# Patient Record
Sex: Female | Born: 1955 | Race: White | Hispanic: No | Marital: Single | Smoking: Former smoker
Health system: Southern US, Community
[De-identification: ages and names within clinical notes are randomized; demographics above are authoritative.]

## PROBLEM LIST (undated history)

## (undated) DIAGNOSIS — Z803 Family history of malignant neoplasm of breast: Secondary | ICD-10-CM

## (undated) DIAGNOSIS — Z8052 Family history of malignant neoplasm of bladder: Secondary | ICD-10-CM

## (undated) HISTORY — PX: BREAST ENHANCEMENT SURGERY: SHX7

## (undated) HISTORY — PX: AUGMENTATION MAMMAPLASTY: SUR837

## (undated) HISTORY — PX: PARTIAL HYSTERECTOMY: SHX80

## (undated) HISTORY — DX: Family history of malignant neoplasm of breast: Z80.3

## (undated) HISTORY — DX: Family history of malignant neoplasm of bladder: Z80.52

---

## 2019-06-25 ENCOUNTER — Ambulatory Visit: Payer: Self-pay | Admitting: Medical

## 2019-06-25 ENCOUNTER — Ambulatory Visit: Payer: Self-pay | Admitting: Family

## 2019-06-29 ENCOUNTER — Other Ambulatory Visit: Payer: Self-pay

## 2019-06-29 ENCOUNTER — Encounter: Payer: Self-pay | Admitting: Medical

## 2019-06-29 ENCOUNTER — Ambulatory Visit (INDEPENDENT_AMBULATORY_CARE_PROVIDER_SITE_OTHER): Payer: PRIVATE HEALTH INSURANCE | Admitting: Medical

## 2019-06-29 VITALS — BP 120/69 | HR 74 | Temp 98.1°F | Resp 12 | Ht 66.0 in | Wt 173.6 lb

## 2019-06-29 DIAGNOSIS — B009 Herpesviral infection, unspecified: Secondary | ICD-10-CM

## 2019-06-29 DIAGNOSIS — L989 Disorder of the skin and subcutaneous tissue, unspecified: Secondary | ICD-10-CM

## 2019-06-29 DIAGNOSIS — Z1231 Encounter for screening mammogram for malignant neoplasm of breast: Secondary | ICD-10-CM

## 2019-06-29 DIAGNOSIS — G4700 Insomnia, unspecified: Secondary | ICD-10-CM

## 2019-06-29 DIAGNOSIS — R4586 Emotional lability: Secondary | ICD-10-CM | POA: Diagnosis not present

## 2019-06-29 DIAGNOSIS — F17200 Nicotine dependence, unspecified, uncomplicated: Secondary | ICD-10-CM

## 2019-06-29 DIAGNOSIS — Z0000 Encounter for general adult medical examination without abnormal findings: Secondary | ICD-10-CM | POA: Diagnosis not present

## 2019-06-29 LAB — LIPID PANEL
Cholesterol: 196 mg/dL (ref 0–200)
HDL: 51.9 mg/dL (ref >39.00)
NonHDL: 143.96
Total CHOL/HDL Ratio: 4
Triglycerides: 263 mg/dL — ABNORMAL HIGH (ref 0.0–149.0)
VLDL: 52.6 mg/dL — ABNORMAL HIGH (ref 0.0–40.0)

## 2019-06-29 LAB — COMPREHENSIVE METABOLIC PANEL
ALT: 16 U/L (ref 0–35)
AST: 12 U/L (ref 0–37)
Albumin: 4.2 g/dL (ref 3.5–5.2)
Alkaline Phosphatase: 76 U/L (ref 39–117)
BUN: 17 mg/dL (ref 6–23)
CO2: 29 mEq/L (ref 19–32)
Calcium: 9.5 mg/dL (ref 8.4–10.5)
Chloride: 103 mEq/L (ref 96–112)
Creatinine, Ser: 0.65 mg/dL (ref 0.40–1.20)
GFR: 92.37 mL/min (ref >60.00)
Glucose, Bld: 95 mg/dL (ref 70–99)
Potassium: 4.7 mEq/L (ref 3.5–5.1)
Sodium: 137 mEq/L (ref 135–145)
Total Bilirubin: 0.3 mg/dL (ref 0.2–1.2)
Total Protein: 6.6 g/dL (ref 6.0–8.3)

## 2019-06-29 LAB — CBC WITH DIFFERENTIAL/PLATELET
Basophils Absolute: 0 10*3/uL (ref 0.0–0.1)
Basophils Relative: 0.2 % (ref 0.0–3.0)
Eosinophils Absolute: 0.1 10*3/uL (ref 0.0–0.7)
Eosinophils Relative: 1.2 % (ref 0.0–5.0)
HCT: 45 % (ref 36.0–46.0)
Hemoglobin: 14.8 g/dL (ref 12.0–15.0)
Lymphocytes Relative: 38.9 % (ref 12.0–46.0)
Lymphs Abs: 2.5 10*3/uL (ref 0.7–4.0)
MCHC: 32.9 g/dL (ref 30.0–36.0)
MCV: 89.2 fl (ref 78.0–100.0)
Monocytes Absolute: 0.5 10*3/uL (ref 0.1–1.0)
Monocytes Relative: 7.6 % (ref 3.0–12.0)
Neutro Abs: 3.4 10*3/uL (ref 1.4–7.7)
Neutrophils Relative %: 52.1 % (ref 43.0–77.0)
Platelets: 293 10*3/uL (ref 150.0–400.0)
RBC: 5.05 Mil/uL (ref 3.87–5.11)
RDW: 13 % (ref 11.5–15.5)
WBC: 6.5 10*3/uL (ref 4.0–10.5)

## 2019-06-29 LAB — LDL CHOLESTEROL, DIRECT: Direct LDL: 116 mg/dL

## 2019-06-29 MED ORDER — VALACYCLOVIR HCL 1 G PO TABS: 1000.0000 mg | ORAL_TABLET | Freq: Every day | ORAL | 11 refills | Status: DC

## 2019-06-29 MED FILL — valACYclovir HCL 1 GM TABS: 1 | 30 days supply | Qty: 30 | Fill #0

## 2019-06-29 NOTE — Patient Instructions (Addendum)
For you wellness exam today I have ordered cbc, cmp and lipid panel.  Flu vaccine declined.  Recommend exercise and healthy diet.  We will let you know lab results as they come in.  Follow up date appointment will be determined after lab review.   For smoking cessation and mood change will rx wellbutrin. Early and not fully convinced depression. But wellbutrin can benefit both.  Put in screening mammogram.  For hx of herpes refilled valtres.  For insomnia will follow and see if sleep better with more exercise and wellbutrin. Med options discussed.   Preventive Care 26-70 Years Old, Female Preventive care refers to visits with your health care provider and lifestyle choices that can promote health and wellness. This includes:  A yearly physical exam. This may also be called an annual well check.  Regular dental visits and eye exams.  Immunizations.  Screening for certain conditions.  Healthy lifestyle choices, such as eating a healthy diet, getting regular exercise, not using drugs or products that contain nicotine and tobacco, and limiting alcohol use. What can I expect for my preventive care visit? Physical exam Your health care provider will check your:  Height and weight. This may be used to calculate body mass index (BMI), which tells if you are at a healthy weight.  Heart rate and blood pressure.  Skin for abnormal spots. Counseling Your health care provider may ask you questions about your:  Alcohol, tobacco, and drug use.  Emotional well-being.  Home and relationship well-being.  Sexual activity.  Eating habits.  Work and work Statistician.  Method of birth control.  Menstrual cycle.  Pregnancy history. What immunizations do I need?  Influenza (flu) vaccine  This is recommended every year. Tetanus, diphtheria, and pertussis (Tdap) vaccine  You may need a Td booster every 10 years. Varicella (chickenpox) vaccine  You may need this if you have  not been vaccinated. Zoster (shingles) vaccine  You may need this after age 18. Measles, mumps, and rubella (MMR) vaccine  You may need at least one dose of MMR if you were born in 1957 or later. You may also need a second dose. Pneumococcal conjugate (PCV13) vaccine  You may need this if you have certain conditions and were not previously vaccinated. Pneumococcal polysaccharide (PPSV23) vaccine  You may need one or two doses if you smoke cigarettes or if you have certain conditions. Meningococcal conjugate (MenACWY) vaccine  You may need this if you have certain conditions. Hepatitis A vaccine  You may need this if you have certain conditions or if you travel or work in places where you may be exposed to hepatitis A. Hepatitis B vaccine  You may need this if you have certain conditions or if you travel or work in places where you may be exposed to hepatitis B. Haemophilus influenzae type b (Hib) vaccine  You may need this if you have certain conditions. Human papillomavirus (HPV) vaccine  If recommended by your health care provider, you may need three doses over 6 months. You may receive vaccines as individual doses or as more than one vaccine together in one shot (combination vaccines). Talk with your health care provider about the risks and benefits of combination vaccines. What tests do I need? Blood tests  Lipid and cholesterol levels. These may be checked every 5 years, or more frequently if you are over 76 years old.  Hepatitis C test.  Hepatitis B test. Screening  Lung cancer screening. You may have this screening every year  starting at age 19 if you have a 30-pack-year history of smoking and currently smoke or have quit within the past 15 years.  Colorectal cancer screening. All adults should have this screening starting at age 32 and continuing until age 80. Your health care provider may recommend screening at age 29 if you are at increased risk. You will have tests  every 1-10 years, depending on your results and the type of screening test.  Diabetes screening. This is done by checking your blood sugar (glucose) after you have not eaten for a while (fasting). You may have this done every 1-3 years.  Mammogram. This may be done every 1-2 years. Talk with your health care provider about when you should start having regular mammograms. This may depend on whether you have a family history of breast cancer.  BRCA-related cancer screening. This may be done if you have a family history of breast, ovarian, tubal, or peritoneal cancers.  Pelvic exam and Pap test. This may be done every 3 years starting at age 33. Starting at age 7, this may be done every 5 years if you have a Pap test in combination with an HPV test. Other tests  Sexually transmitted disease (STD) testing.  Bone density scan. This is done to screen for osteoporosis. You may have this scan if you are at high risk for osteoporosis. Follow these instructions at home: Eating and drinking  Eat a diet that includes fresh fruits and vegetables, whole grains, lean protein, and low-fat dairy.  Take vitamin and mineral supplements as recommended by your health care provider.  Do not drink alcohol if: ? Your health care provider tells you not to drink. ? You are pregnant, may be pregnant, or are planning to become pregnant.  If you drink alcohol: ? Limit how much you have to 0-1 drink a day. ? Be aware of how much alcohol is in your drink. In the [place] one drink equals one 12 oz bottle of beer (355 mL), one 5 oz glass of wine (148 mL), or one 1 oz glass of hard liquor (44 mL). Lifestyle  Take daily care of your teeth and gums.  Stay active. Exercise for at least 30 minutes on 5 or more days each week.  Do not use any products that contain nicotine or tobacco, such as cigarettes, e-cigarettes, and chewing tobacco. If you need help quitting, ask your health care provider.  If you are sexually  active, practice safe sex. Use a condom or other form of birth control (contraception) in order to prevent pregnancy and STIs (sexually transmitted infections).  If told by your health care provider, take low-dose aspirin daily starting at age 84. What's next?  Visit your health care provider once a year for a well check visit.  Ask your health care provider how often you should have your eyes and teeth checked.  Stay up to date on all vaccines. This information is not intended to replace advice given to you by your health care provider. Make sure you discuss any questions you have with your health care provider. Document Revised: 01/08/2018 Document Reviewed: 01/08/2018 Elsevier Patient Education  2020 [employer].

## 2019-06-29 NOTE — Progress Notes (Signed)
Subjective:    Patient ID: Jodi Cameron, female    DOB: 03-Dec-1955, 64 y.o.   MRN: [id number]  HPI  Pt in for first time. She moved from [place].  New to area. 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 Not much exercise recently. Pt does smoker.Alcohol one night a week. Bottle of wine over 2 nights. But recently cut back/none in past 2 weeks. Has hobbies. She is now down to smoking 1 pack a week.  Pt states some decreased mood due to covid pandemic, weather and minimal social interaction. She does enjoy walking her dogs. Pt denies hx of depression. Only notes some decrease after answering phq-9 score.  No chronic medical problems.   Last pap smear 2 years ago.  2 years since last mammogram.    Colonoscopy last year and found polyp.Told to repeat in 3 years.  Pt has hx type II herpes.     Review of Systems  Constitutional: Negative for chills, fatigue and fever.  HENT: Negative for congestion and ear discharge.   Respiratory: Negative for chest tightness, shortness of breath and wheezing.   Cardiovascular: Negative for chest pain and palpitations.  Gastrointestinal: Negative for abdominal pain.  Musculoskeletal: Negative for back pain and joint swelling.  Neurological: Negative for dizziness, speech difficulty, weakness, numbness and headaches.  Hematological: Negative for adenopathy. Does not bruise/bleed easily.  Psychiatric/Behavioral: Positive for dysphoric mood and sleep disturbance. Negative for behavioral problems, confusion, hallucinations and suicidal ideas. The patient is not nervous/anxious.    History reviewed. No pertinent past medical history.   Social History   Socioeconomic History  . Marital status: Single    Spouse name: Not on file  . Number of children: Not on file  . Years of education: Not on file  . Highest education level: Not on file  Occupational History  . Not on file  Tobacco Use  . Smoking status: Current Every Day Smoker  . Smokeless  tobacco: Never Used  . Tobacco comment: 1 pack a week  Substance and Sexual Activity  . Alcohol use: Yes    Comment: weekend   . Drug use: Not Currently  . Sexual activity: Not on file  Other Topics Concern  . Not on file  Social History Narrative  . Not on file   Social Determinants of Health   Financial Resource Strain:   . Difficulty of Paying Living Expenses: Not on file  Food Insecurity:   . Worried About Programme researcher, broadcasting/film/video in the Last Year: Not on file  . Ran Out of Food in the Last Year: Not on file  Transportation Needs:   . Lack of Transportation (Medical): Not on file  . Lack of Transportation (Non-Medical): Not on file  Physical Activity:   . Days of Exercise per Week: Not on file  . Minutes of Exercise per Session: Not on file  Stress:   . Feeling of Stress : Not on file  Social Connections:   . Frequency of Communication with Friends and Family: Not on file  . Frequency of Social Gatherings with Friends and Family: Not on file  . Attends Religious Services: Not on file  . Active Member of Clubs or Organizations: Not on file  . Attends Banker Meetings: Not on file  . Marital Status: Not on file  Intimate Partner Violence:   . Fear of Current or Ex-Partner: Not on file  . Emotionally Abused: Not on file  . Physically Abused: Not on file  .  Sexually Abused: Not on file    Past Surgical History:  Procedure Laterality Date  . BREAST ENHANCEMENT SURGERY    . PARTIAL HYSTERECTOMY      Family History  Problem Relation Age of Onset  . Alzheimer's disease Mother   . Colon cancer Maternal Grandmother   . Breast cancer Half-Sister     Not on File  No current outpatient medications on file prior to visit.   No current facility-administered medications on file prior to visit.    BP 120/69 (BP Location: Left Arm, Cuff Size: Normal)   Pulse 74   Temp 98.1 F (36.7 C) (Temporal)   Resp 12   Ht 5\' 6"  (1.676 m)   Wt 173 lb 9.6 oz (78.7 kg)    SpO2 99%   BMI 28.02 kg/m       Objective:   Physical Exam   General Mental Status- Alert. General Appearance- Not in acute distress.   Skin General: Color- Normal Color. Moisture- Normal Moisture.  Small lesion left cheek under left eyel  Neck Carotid Arteries- Normal color. Moisture- Normal Moisture. No carotid bruits. No JVD.  Chest and Lung Exam Auscultation: Breath Sounds:-Normal.  Cardiovascular Auscultation:Rythm- Regular. Murmurs & Other Heart Sounds:Auscultation of the heart reveals- No Murmurs.  Abdomen Inspection:-Inspeection Normal. Palpation/Percussion:Note:No mass. Palpation and Percussion of the abdomen reveal- Non Tender, Non Distended + BS, no rebound or guarding.    Neurologic Cranial Nerve exam:- CN III-XII intact(No nystagmus), symmetric smile. Strength:- 5/5 equal and symmetric strength both upper and lower extremities.     Assessment & Plan:  For you wellness exam today I have ordered cbc, cmp and lipid panel.  Flu vaccine declined.  Recommend exercise and healthy diet.  We will let you know lab results as they come in.  Follow up date appointment will be determined after lab review.   For smoking cessation and mood change will rx wellbutrin. Early and not fully convinced depression. But wellbutrin can benefit both.  Put in screening mammogram.  For insomnia will follow and see if sleep better with more exercise and wellbutrin. Med options discussed.  Semya Klinke, PA-C  20 additonal minutes apart from wellness. Discuss herpes, mood, inosmnia and skin lesion.

## 2019-07-02 ENCOUNTER — Telehealth: Payer: Self-pay | Admitting: Medical

## 2019-07-02 ENCOUNTER — Encounter: Payer: Self-pay | Admitting: Medical

## 2019-07-02 DIAGNOSIS — Z124 Encounter for screening for malignant neoplasm of cervix: Secondary | ICD-10-CM

## 2019-07-02 MED ORDER — BUPROPION HCL ER (XL) 150 MG PO TB24: 150.0000 mg | ORAL_TABLET | Freq: Every day | ORAL | 2 refills | Status: DC

## 2019-07-02 NOTE — Telephone Encounter (Signed)
Wellbutrin rx sent.

## 2019-07-02 NOTE — Telephone Encounter (Signed)
Referral to gyn placed. 

## 2019-07-02 NOTE — Telephone Encounter (Signed)
I sent her a message to let her know that we did send referral for dermatology.  We will need to send in Wellbutrin and not sure if you had any recommendations on mammograms or pap.

## 2019-07-05 MED FILL — buPROPion HCL ER (XL) 150 M: 150 | 30 days supply | Qty: 30 | Fill #0

## 2019-07-16 ENCOUNTER — Other Ambulatory Visit: Payer: Self-pay | Admitting: Medical

## 2019-07-16 ENCOUNTER — Encounter (HOSPITAL_BASED_OUTPATIENT_CLINIC_OR_DEPARTMENT_OTHER): Payer: Self-pay

## 2019-07-16 ENCOUNTER — Other Ambulatory Visit: Payer: Self-pay

## 2019-07-16 ENCOUNTER — Ambulatory Visit (HOSPITAL_BASED_OUTPATIENT_CLINIC_OR_DEPARTMENT_OTHER)
Admission: RE | Admit: 2019-07-16 | Discharge: 2019-07-16 | Disposition: A | Discharge status: Home / Self Care | Payer: 59 | Source: Ambulatory Visit | Attending: Medical | Admitting: Medical

## 2019-07-16 DIAGNOSIS — Z1231 Encounter for screening mammogram for malignant neoplasm of breast: Secondary | ICD-10-CM | POA: Diagnosis not present

## 2019-08-19 ENCOUNTER — Encounter: Payer: Self-pay | Admitting: Obstetrics & Gynecology

## 2019-08-19 ENCOUNTER — Ambulatory Visit (INDEPENDENT_AMBULATORY_CARE_PROVIDER_SITE_OTHER): Payer: 59 | Admitting: Obstetrics & Gynecology

## 2019-08-19 ENCOUNTER — Other Ambulatory Visit: Payer: Self-pay

## 2019-08-19 VITALS — BP 135/82 | HR 70 | Ht 66.0 in | Wt 167.0 lb

## 2019-08-19 DIAGNOSIS — Z01419 Encounter for gynecological examination (general) (routine) without abnormal findings: Secondary | ICD-10-CM

## 2019-08-19 DIAGNOSIS — Z90710 Acquired absence of both cervix and uterus: Secondary | ICD-10-CM | POA: Diagnosis not present

## 2019-08-19 NOTE — Progress Notes (Signed)
Subjective:     Jodi Cameron is a 64 y.o. female here for a routine exam. LMP >36 years prev.    Current complaints: none  Denies leaking of urine; no vaginal atrophy sx. Pt not sexually active.     Gynecologic History No LMP recorded. Patient is postmenopausal. Contraception: status post hysterectomy Last Pap: >3 years prev Last mammogram: 07/16/2019  Results were: normal  Obstetric History OB History  Gravida Para Term Preterm AB Living  4 3 3   1 3   SAB TAB Ectopic Multiple Live Births  1       3    # Outcome Date GA Lbr Len/2nd Weight Sex Delivery Anes PTL Lv  4 Term 30 [redacted]w[redacted]d   M Vag-Spont EPI N LIV  3 Term 9 [redacted]w[redacted]d   F Vag-Spont EPI N LIV  2 Term 20 [redacted]w[redacted]d   F Vag-Spont None N LIV  1 SAB            The following portions of the patient's history were reviewed and updated as appropriate: allergies, current medications, past family history, past medical history, past social history, past surgical history and problem list.  Review of Systems Pertinent items are noted in HPI.    Objective:  BP 135/82   Pulse 70   Ht 5\' 6"  (1.676 m)   Wt 167 lb (75.8 kg)   BMI 26.95 kg/m  General Appearance:    Alert, cooperative, no distress, appears stated age  Head:    Normocephalic, without obvious abnormality, atraumatic  Eyes:    conjunctiva/corneas clear, EOM's intact, both eyes  Ears:    Normal external ear canals, both ears  Nose:   Nares normal, septum midline, mucosa normal, no drainage    or sinus tenderness  Throat:   Lips, mucosa, and tongue normal; teeth and gums normal  Neck:   Supple, symmetrical, trachea midline, no adenopathy;    thyroid:  no enlargement/tenderness/nodules  Back:     Symmetric, no curvature, ROM normal, no CVA tenderness  Lungs:     respirations unlabored  Chest Wall:    No tenderness or deformity   Heart:    Regular rate and rhythm  Breast Exam:   pt declined.   Abdomen:     Soft, non-tender, bowel sounds active all four quadrants,    no masses,  no organomegaly  Genitalia:    Normal female without lesion, discharge or tenderness   Vaginal cuff well healed.   Extremities:   Extremities normal, atraumatic, no cyanosis or edema  Pulses:   2+ and symmetric all extremities  Skin:   Skin color, texture, turgor normal, no rashes or lesions    Assessment:    Healthy female exam.   Pt with questions re Covid vaccine   Plan:   F/u in 1 year or sooner prn Reviewed Covid vaccine. All questions answered.   Sincere Berlanga L. Harraway-Smith, M.D., [redacted]w[redacted]d

## 2020-03-18 ENCOUNTER — Other Ambulatory Visit: Payer: Self-pay

## 2020-03-18 ENCOUNTER — Emergency Department (INDEPENDENT_AMBULATORY_CARE_PROVIDER_SITE_OTHER)
Admission: EM | Admit: 2020-03-18 | Discharge: 2020-03-18 | Disposition: A | Discharge status: Home / Self Care | Payer: 59 | Source: Home / Self Care | Attending: Family Medicine | Admitting: Family Medicine

## 2020-03-18 DIAGNOSIS — R059 Cough, unspecified: Secondary | ICD-10-CM | POA: Diagnosis not present

## 2020-03-18 DIAGNOSIS — J069 Acute upper respiratory infection, unspecified: Secondary | ICD-10-CM

## 2020-03-18 MED ORDER — AZITHROMYCIN 250 MG PO TABS: ORAL_TABLET | ORAL | 0 refills | Status: DC

## 2020-03-18 NOTE — ED Provider Notes (Signed)
[facility]    CSN: [id number] Arrival date & time: 03/18/20  0803      History   Chief Complaint Chief Complaint  Patient presents with  . Cough    HPI Jodi Cameron is a 64 y.o. female.   Patient's 67 month old granddaughter (who is in daycare) developed URI symptoms about 1.5 weeks ago.  Patient subsequently became fatigued.  About 3 to 4 days ago she developed a non-productive cough, nasal congestion, low grade fever, mild myalgias, and mild sore throat.   She denies chest tightness, shortness of breath, and changes in taste/smell. She has had bronchitis in the past, and pneumonia as a child.  The history is provided by the patient.    History reviewed. No pertinent past medical history.  There are no problems to display for this patient.   Past Surgical History:  Procedure Laterality Date  . AUGMENTATION MAMMAPLASTY Bilateral    Subglandular, pt's second set of implants  . BREAST ENHANCEMENT SURGERY    . PARTIAL HYSTERECTOMY      OB History    Gravida  4   Para  3   Term  3   Preterm      AB  1   Living  3     SAB  1   TAB      Ectopic      Multiple      Live Births  3            Home Medications    Prior to Admission medications   Medication Sig Start Date End Date Taking? Authorizing Provider  diphenhydrAMINE (BENADRYL) 25 MG tablet Take 25 mg by mouth every 6 (six) hours as needed.   Yes [provider]  ibuprofen (ADVIL) 200 MG tablet Take 200 mg by mouth every 6 (six) hours as needed.   Yes [provider]  azithromycin (ZITHROMAX Z-PAK) 250 MG tablet Take 2 tabs today; then begin one tab once daily for 4 more days. 03/18/20   Lattie Haw, MD  buPROPion (WELLBUTRIN XL) 150 MG 24 hr tablet Take 1 tablet (150 mg total) by mouth daily. 07/02/19   Saguier, Ramon Dredge, PA-C  valACYclovir (VALTREX) 1000 MG tablet Take 1 tablet (1,000 mg total) by mouth daily. 06/29/19   Saguier, Ramon Dredge, PA-C    Family  History Family History  Problem Relation Age of Onset  . Alzheimer's disease Mother   . Colon cancer Maternal Grandmother   . Breast cancer Half-Sister     Social History Social History   Tobacco Use  . Smoking status: Former Smoker    Quit date: 11/16/2019    Years since quitting: 0.3  . Smokeless tobacco: Never Used  . Tobacco comment: 1 pack a week  Substance Use Topics  . Alcohol use: Yes    Comment: weekend   . Drug use: Not Currently     Allergies   Codeine   Review of Systems Review of Systems+ sore throat + cough No pleuritic pain No wheezing + nasal congestion + post-nasal drainage No sinus pain/pressure No itchy/red eyes No earache No hemoptysis No SOB + fever, + chills No nausea No vomiting No abdominal pain No diarrhea No urinary symptoms No skin rash + fatigue + myalgias No headache Used OTC meds (Nyquil, etc) without relief    Physical Exam Triage Vital Signs ED Triage Vitals  Enc Vitals Group     BP 03/18/20 0826 137/90     Pulse Rate  03/18/20 0826 86     Resp --      Temp 03/18/20 0844 98.6 F (37 C)     Temp Source 03/18/20 0826 Oral     SpO2 03/18/20 0826 96 %     Weight 03/18/20 0824 160 lb (72.6 kg)     Height 03/18/20 0824 5\' 7"  (1.702 m)     Head Circumference --      Peak Flow --      Pain Score 03/18/20 0823 0     Pain Loc --      Pain Edu? --      Excl. in GC? --    No data found.  Updated Vital Signs BP 137/90   Pulse 86   Temp 98.6 F (37 C) (Oral)   Ht 5\' 7"  (1.702 m)   Wt 72.6 kg   SpO2 96%   BMI 25.06 kg/m   Visual Acuity Right Eye Distance:   Left Eye Distance:   Bilateral Distance:    Right Eye Near:   Left Eye Near:    Bilateral Near:     Physical Exam Nursing notes and Vital Signs reviewed. Appearance:  Patient appears stated age, and in no acute distress Eyes:  Pupils are equal, round, and reactive to light and accomodation.  Extraocular movement is intact.  Conjunctivae are not inflamed   Ears:  Canals normal.  Tympanic membranes normal.  Nose:  Mildly congested turbinates.  No sinus tenderness.   Pharynx:  Normal Neck:  Supple.  Mildly enlarged lateral nodes are present, tender to palpation on the left.   Lungs:  Clear to auscultation.  Breath sounds are equal.  Moving air well. Heart:  Regular rate and rhythm without murmurs, rubs, or gallops.  Abdomen:  Nontender without masses or hepatosplenomegaly.  Bowel sounds are present.  No CVA or flank tenderness.  Extremities:  No edema.  Skin:  No rash present.   UC Treatments / Results  Labs (all labs ordered are listed, but only abnormal results are displayed) Labs Reviewed  NOVEL CORONAVIRUS, NAA    EKG   Radiology No results found.  Procedures Procedures (including critical care time)  Medications Ordered in UC Medications - No data to display  Initial Impression / Assessment and Plan / UC Course  I have reviewed the triage vital signs and the nursing notes.  Pertinent labs & imaging results that were available during my care of the patient were reviewed by me and considered in my medical decision making (see chart for details).    Benign exam.  There is no evidence of bacterial infection today.  Treat symptomatically for now. COVID PCR pending  Followup with Family Doctor if not improved in about 10 days.  Final Clinical Impressions(s) / UC Diagnoses   Final diagnoses:  Cough  Viral URI with cough     Discharge Instructions     Take plain guaifenesin (1200mg  extended release tabs such as Mucinex) twice daily, with plenty of water, for cough and congestion.  May add Pseudoephedrine (30mg , one or two every 4 to 6 hours) for sinus congestion.  Get adequate rest.   May use Afrin nasal spray (or generic oxymetazoline) each morning for about 5 days and then discontinue.  Also recommend using saline nasal spray several times daily and saline nasal irrigation (AYR is a common brand).  Use Flonase nasal  spray each morning after using Afrin nasal spray and saline nasal irrigation. Try warm salt water gargles for sore throat.  Stop all antihistamines (Nyquil, etc) for now, and other non-prescription cough/cold preparations. May take Ibuprofen 200mg , 4 tabs every 8 hours with food body aches, headache, etc. May take Delsym Cough Suppressant ("12 Hour Cough Relief") at bedtime for nighttime cough.  Begin Azithromycin if not improving about one week or if persistent fever develops (Given a prescription to hold, with an expiration date)    Isolate yourself until COVID-19 test result is available.   If your COVID19 test is positive, then you are infected with the novel coronavirus and could give the virus to others.  Please continue isolation at home for at least 10 days since the start of your symptoms.  Once you complete your 10 day quarantine, you may return to normal activities as long as you've not had a fever for over 24 hours (without taking fever reducing medicine) and your symptoms are improving. Please continue good preventive care measures, including:  frequent hand-washing, avoid touching your face, cover coughs/sneezes, stay out of crowds and keep a 6 foot distance from others.  Go to the nearest hospital emergency room if fever/cough/breathlessness are severe or illness seems like a threat to life.     ED Prescriptions    Medication Sig Dispense Auth. Provider   azithromycin (ZITHROMAX Z-PAK) 250 MG tablet Take 2 tabs today; then begin one tab once daily for 4 more days. 6 tablet , MD        Lattie Haw, MD 03/18/20 [phone]

## 2020-03-18 NOTE — ED Triage Notes (Signed)
x3 days. Pt states that she has a cough, nasal congestion and fatigue. Pt states that she is vaccinated.

## 2020-03-18 NOTE — ED Triage Notes (Signed)
Pt states that she had a temp of 100.1 x1 day ago.

## 2020-03-18 NOTE — Discharge Instructions (Addendum)
Take plain guaifenesin (1200mg  extended release tabs such as Mucinex) twice daily, with plenty of water, for cough and congestion.  May add Pseudoephedrine (30mg , one or two every 4 to 6 hours) for sinus congestion.  Get adequate rest.   May use Afrin nasal spray (or generic oxymetazoline) each morning for about 5 days and then discontinue.  Also recommend using saline nasal spray several times daily and saline nasal irrigation (AYR is a common brand).  Use Flonase nasal spray each morning after using Afrin nasal spray and saline nasal irrigation. Try warm salt water gargles for sore throat.  Stop all antihistamines (Nyquil, etc) for now, and other non-prescription cough/cold preparations. May take Ibuprofen 200mg , 4 tabs every 8 hours with food body aches, headache, etc. May take Delsym Cough Suppressant ("12 Hour Cough Relief") at bedtime for nighttime cough.  Begin Azithromycin if not improving about one week or if persistent fever develops.  Isolate yourself until COVID-19 test result is available.   If your COVID19 test is positive, then you are infected with the novel coronavirus and could give the virus to others.  Please continue isolation at home for at least 10 days since the start of your symptoms.  Once you complete your 10 day quarantine, you may return to normal activities as long as you've not had a fever for over 24 hours (without taking fever reducing medicine) and your symptoms are improving. Please continue good preventive care measures, including:  frequent hand-washing, avoid touching your face, cover coughs/sneezes, stay out of crowds and keep a 6 foot distance from others.  Go to the nearest hospital emergency room if fever/cough/breathlessness are severe or illness seems like a threat to life.

## 2020-03-22 LAB — NOVEL CORONAVIRUS, NAA: SARS-CoV-2, NAA: NOT DETECTED

## 2020-10-23 ENCOUNTER — Other Ambulatory Visit (HOSPITAL_BASED_OUTPATIENT_CLINIC_OR_DEPARTMENT_OTHER): Payer: Self-pay | Admitting: Medical

## 2020-10-23 DIAGNOSIS — Z1231 Encounter for screening mammogram for malignant neoplasm of breast: Secondary | ICD-10-CM

## 2020-10-30 ENCOUNTER — Ambulatory Visit (HOSPITAL_BASED_OUTPATIENT_CLINIC_OR_DEPARTMENT_OTHER)
Admission: RE | Admit: 2020-10-30 | Discharge: 2020-10-30 | Disposition: A | Discharge status: Home / Self Care | Payer: 59 | Source: Ambulatory Visit | Attending: Medical | Admitting: Medical

## 2020-10-30 ENCOUNTER — Other Ambulatory Visit: Payer: Self-pay

## 2020-10-30 DIAGNOSIS — Z1231 Encounter for screening mammogram for malignant neoplasm of breast: Secondary | ICD-10-CM | POA: Insufficient documentation

## 2020-11-20 ENCOUNTER — Encounter: Payer: 59 | Admitting: Medical

## 2021-09-18 ENCOUNTER — Other Ambulatory Visit (HOSPITAL_BASED_OUTPATIENT_CLINIC_OR_DEPARTMENT_OTHER): Payer: Self-pay | Admitting: Medical

## 2021-09-18 DIAGNOSIS — Z1231 Encounter for screening mammogram for malignant neoplasm of breast: Secondary | ICD-10-CM

## 2021-11-20 ENCOUNTER — Ambulatory Visit (HOSPITAL_BASED_OUTPATIENT_CLINIC_OR_DEPARTMENT_OTHER)
Admission: RE | Admit: 2021-11-20 | Discharge: 2021-11-20 | Disposition: A | Discharge status: Home / Self Care | Payer: Commercial Managed Care - PPO | Source: Ambulatory Visit | Attending: Medical | Admitting: Medical

## 2021-11-20 ENCOUNTER — Other Ambulatory Visit (HOSPITAL_BASED_OUTPATIENT_CLINIC_OR_DEPARTMENT_OTHER): Payer: Self-pay | Admitting: Medical

## 2021-11-20 ENCOUNTER — Encounter (HOSPITAL_BASED_OUTPATIENT_CLINIC_OR_DEPARTMENT_OTHER): Payer: Self-pay

## 2021-11-20 DIAGNOSIS — Z1231 Encounter for screening mammogram for malignant neoplasm of breast: Secondary | ICD-10-CM

## 2021-11-21 ENCOUNTER — Encounter: Payer: Self-pay | Admitting: Obstetrics & Gynecology

## 2021-11-21 ENCOUNTER — Ambulatory Visit (INDEPENDENT_AMBULATORY_CARE_PROVIDER_SITE_OTHER): Payer: Commercial Managed Care - PPO | Admitting: Obstetrics & Gynecology

## 2021-11-21 VITALS — BP 124/88 | HR 82 | Ht 67.0 in | Wt 170.0 lb

## 2021-11-21 DIAGNOSIS — Z01419 Encounter for gynecological examination (general) (routine) without abnormal findings: Secondary | ICD-10-CM

## 2021-11-21 NOTE — Progress Notes (Signed)
Subjective:     Jodi Cameron is a 66 y.o. female here for a routine exam.  Current complaints: none.     Gynecologic History No LMP recorded. Patient has had a hysterectomy. Contraception: post menopausal status Last Pap: unknown.  Last mammogram: upcoming  Obstetric History OB History  Gravida Para Term Preterm AB Living  4 3 3   1 3   SAB IAB Ectopic Multiple Live Births  1       3    # Outcome Date GA Lbr Len/2nd Weight Sex Delivery Anes PTL Lv  4 Term 69 [redacted]w[redacted]d   M Vag-Spont EPI N LIV  3 Term 75 [redacted]w[redacted]d   F Vag-Spont EPI N LIV  2 Term 48 [redacted]w[redacted]d   F Vag-Spont None N LIV  1 SAB              The following portions of the patient's history were reviewed and updated as appropriate: allergies, current medications, past family history, past medical history, past social history, past surgical history, and problem list.  Review of Systems Pertinent items are noted in HPI.    Objective:  BP 124/88   Pulse 82   Ht 5\' 7"  (1.702 m)   Wt 170 lb (77.1 kg)   BMI 26.63 kg/m   General Appearance:    Alert, cooperative, no distress, appears stated age  Head:    Normocephalic, without obvious abnormality, atraumatic  Eyes:    conjunctiva/corneas clear, EOM's intact, both eyes  Ears:    Normal external ear canals, both ears  Nose:   Nares normal, septum midline, mucosa normal, no drainage    or sinus tenderness  Throat:   Lips, mucosa, and tongue normal; teeth and gums normal  Neck:   Supple, symmetrical, trachea midline, no adenopathy;    thyroid:  no enlargement/tenderness/nodules  Back:     Symmetric, no curvature, ROM normal, no CVA tenderness  Lungs:     respirations unlabored  Chest Wall:    No tenderness or deformity   Heart:    Regular rate and rhythm  Breast Exam:    No tenderness, masses, or nipple abnormality  Abdomen:     Soft, non-tender, bowel sounds active all four quadrants,    no masses, no organomegaly  Genitalia:    Normal female without lesion, discharge or  tenderness     Extremities:   Extremities normal, atraumatic, no cyanosis or edema  Pulses:   2+ and symmetric all extremities  Skin:   Skin color, texture, turgor normal, no rashes or lesions     Assessment:    Healthy female exam.    Plan:   Diagnoses and all orders for this visit:  Well female exam with routine gynecological exam   F/u in 1 year or sooner prn   Priscilla Kirstein L. Harraway-Smith, M.D., [redacted]w[redacted]d

## 2021-12-05 ENCOUNTER — Encounter: Payer: Self-pay | Admitting: Obstetrics & Gynecology

## 2022-04-23 ENCOUNTER — Encounter: Payer: Self-pay | Admitting: Obstetrics & Gynecology

## 2022-04-23 ENCOUNTER — Other Ambulatory Visit: Payer: Self-pay

## 2022-04-23 DIAGNOSIS — B009 Herpesviral infection, unspecified: Secondary | ICD-10-CM

## 2022-04-23 MED ORDER — VALACYCLOVIR HCL 500 MG PO TABS: ORAL_TABLET | ORAL | 11 refills | Status: DC

## 2022-04-23 NOTE — Progress Notes (Signed)
Patient sent Mychart message requesting a refill of Valtrex. Valtrex 500 mg BID x 3 days with refills PRN for 1 year has been sent to her pharmacy. Patient notified of Rx order via Mychart. Io Dieujuste l Callum Wolf, CMA

## 2022-05-10 IMAGING — MG DIGITAL SCREENING BREAST BILAT IMPLANT W/ TOMO W/ CAD
8 of 12 series · 8 of 28 positions shown · non-contrast
Comparison: Previous exam(s).

CLINICAL DATA: Screening.

EXAM:
DIGITAL SCREENING BILATERAL MAMMOGRAM WITH IMPLANTS, CAD AND
TOMOSYNTHESIS
TECHNIQUE: Bilateral screening digital craniocaudal and mediolateral oblique
mammograms were obtained. Bilateral screening digital breast
tomosynthesis was performed. The images were evaluated with
computer-aided detection. Standard and/or implant displaced views
were performed.

[R MLO]
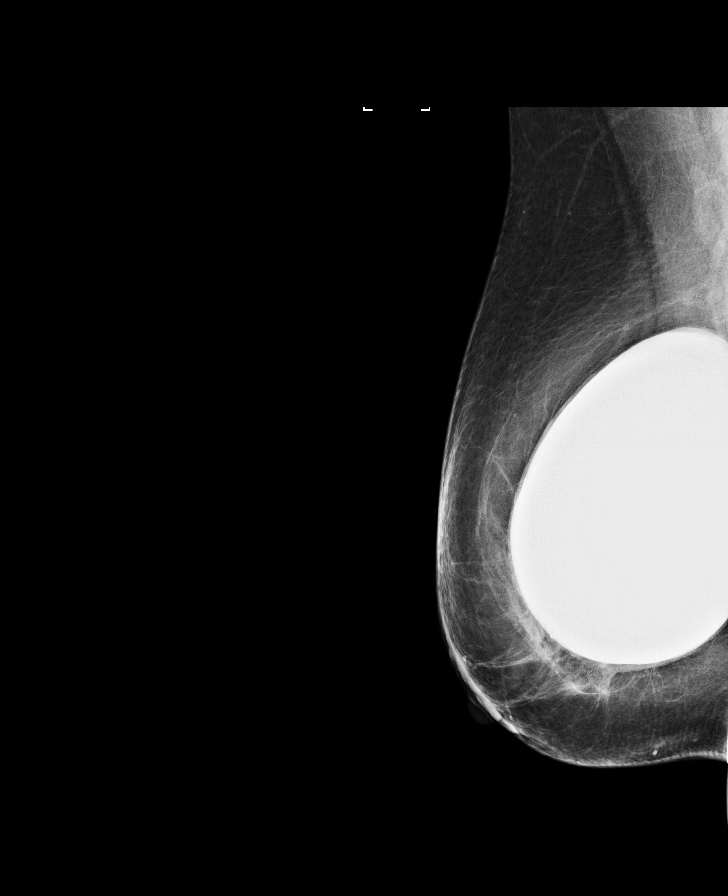

[R CC]
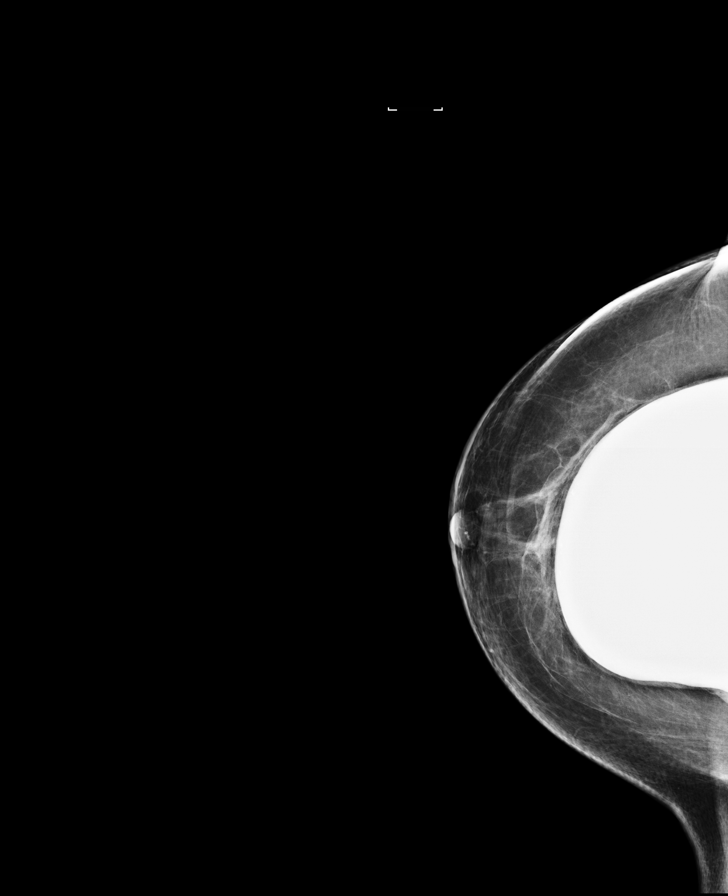

[L MLO]
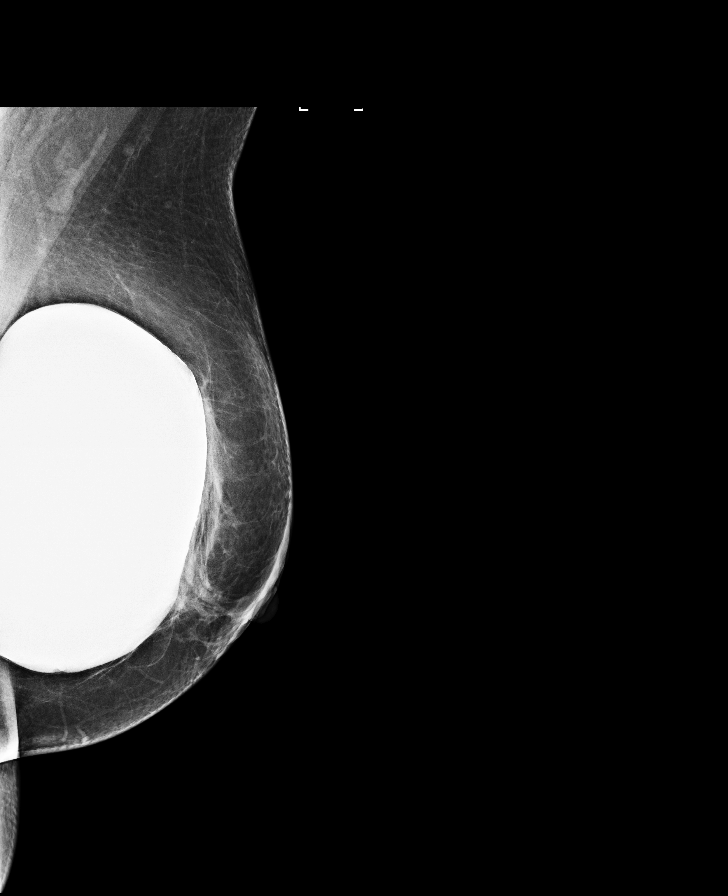

[L CC]
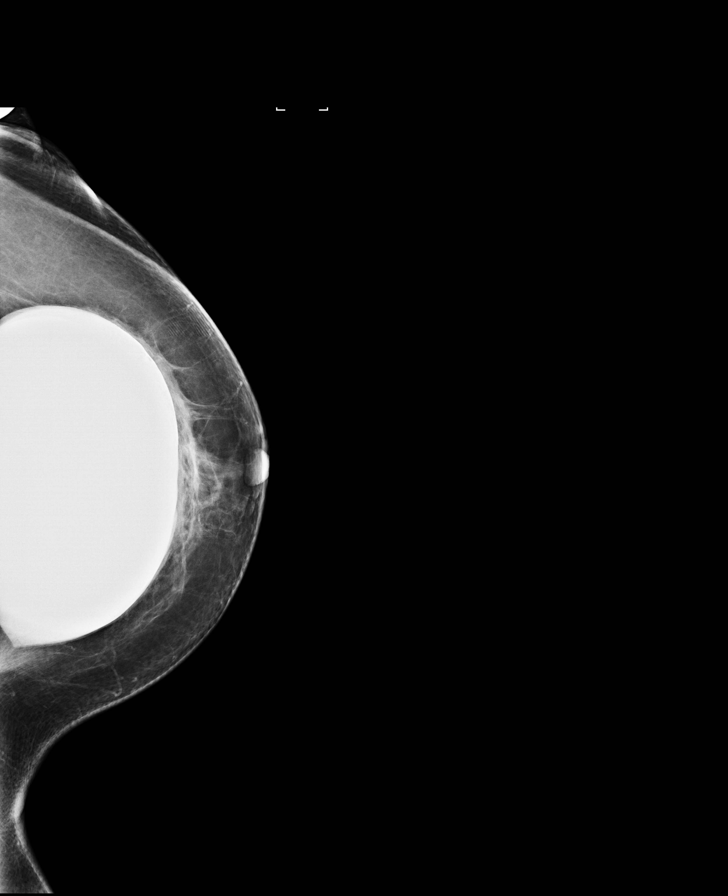

[L CC synth-2D]
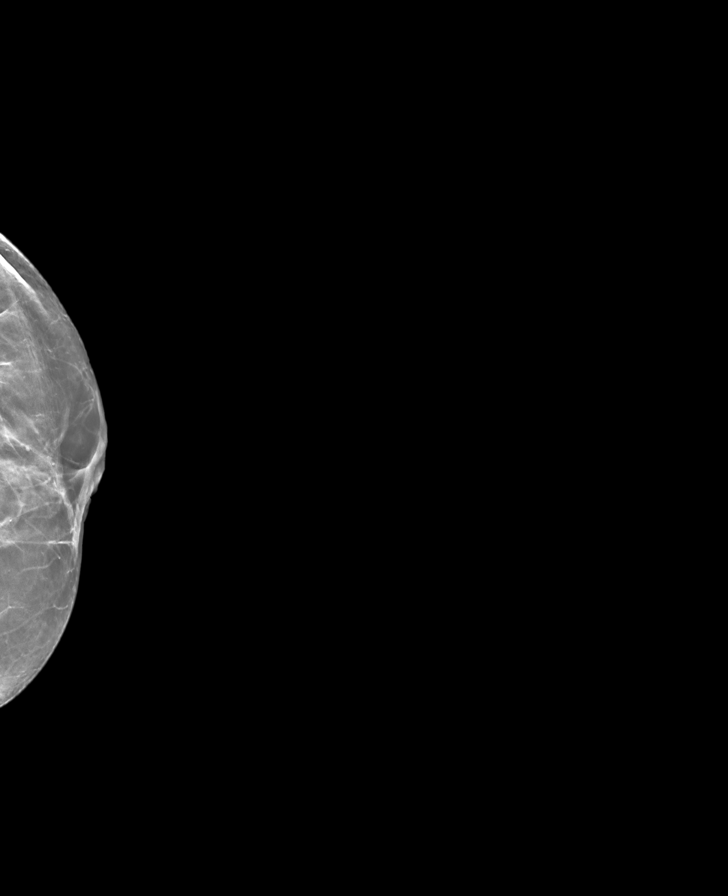

[R MLO synth-2D]
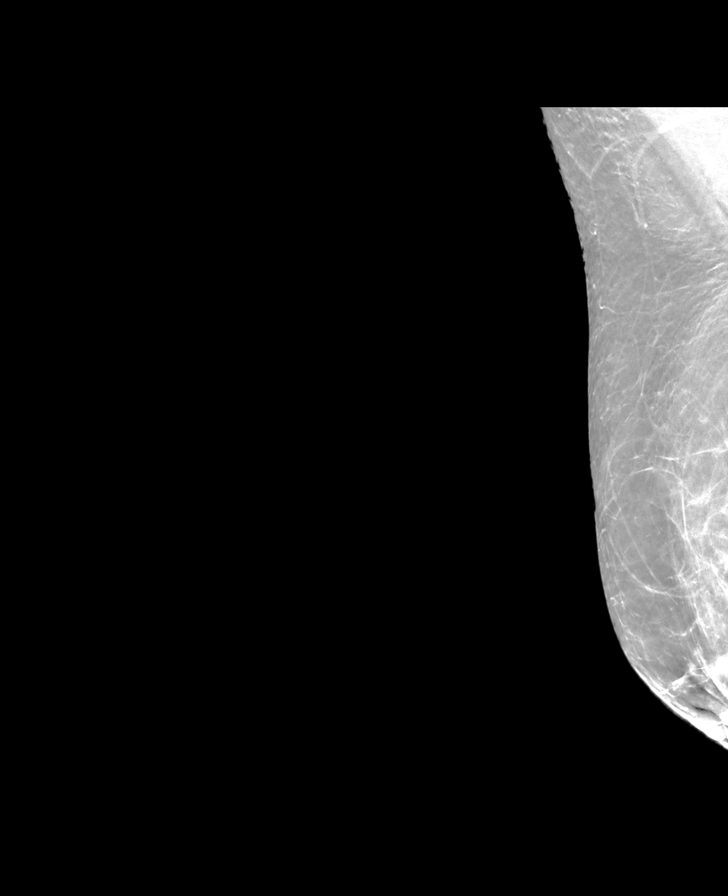

[R CC synth-2D]
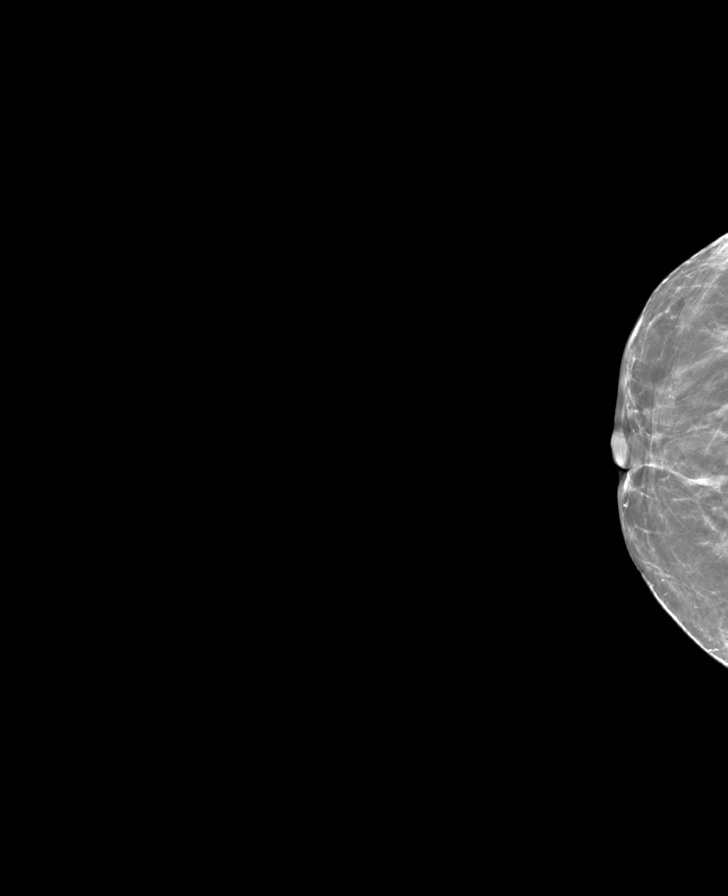

[L MLO synth-2D]
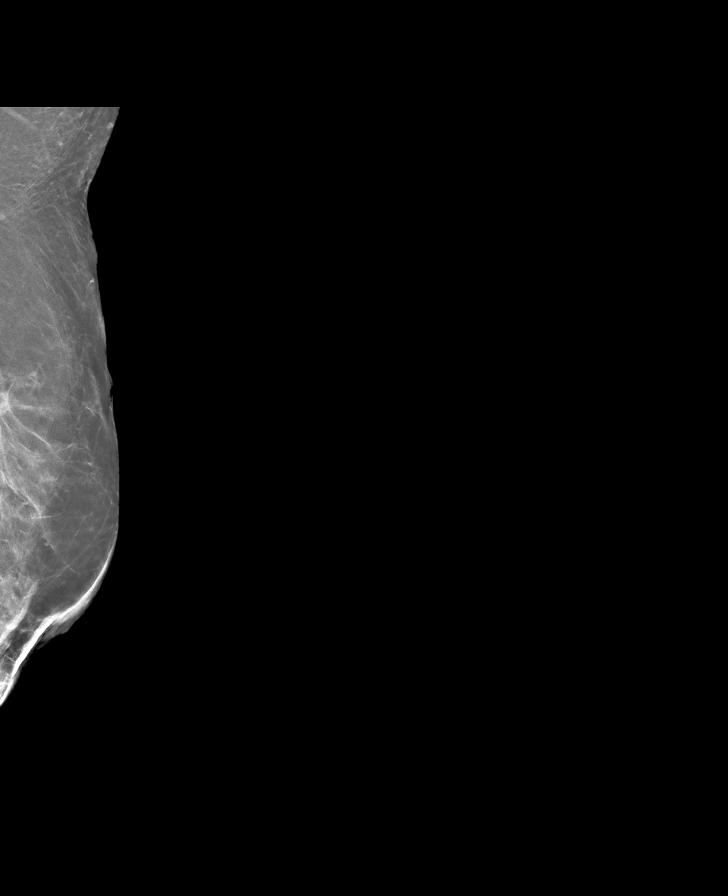

[8 of 28 positions shown; findings below may reference images not displayed]

ACR Breast Density Category c: The breast tissue is heterogeneously
dense, which may obscure small masses.
FINDINGS: The patient has prepectoral gel implants. There are no findings
suspicious for malignancy.
IMPRESSION: No mammographic evidence of malignancy. A result letter of this
screening mammogram will be mailed directly to the patient.

RECOMMENDATION:
Screening mammogram in one year. (Code:98-V-JHD)

BI-RADS CATEGORY  1:  Negative.

## 2022-08-12 HISTORY — PX: OTHER SURGICAL HISTORY: SHX169

## 2022-11-26 ENCOUNTER — Other Ambulatory Visit: Payer: Self-pay | Admitting: Obstetrics & Gynecology

## 2022-11-26 DIAGNOSIS — Z1231 Encounter for screening mammogram for malignant neoplasm of breast: Secondary | ICD-10-CM

## 2022-11-27 ENCOUNTER — Ambulatory Visit: Payer: Medicare Other

## 2022-11-27 ENCOUNTER — Other Ambulatory Visit: Payer: Self-pay | Admitting: Obstetrics & Gynecology

## 2022-11-27 DIAGNOSIS — Z1231 Encounter for screening mammogram for malignant neoplasm of breast: Secondary | ICD-10-CM

## 2022-12-11 ENCOUNTER — Ambulatory Visit (INDEPENDENT_AMBULATORY_CARE_PROVIDER_SITE_OTHER): Payer: Medicare Other | Admitting: Obstetrics & Gynecology

## 2022-12-11 ENCOUNTER — Other Ambulatory Visit (HOSPITAL_BASED_OUTPATIENT_CLINIC_OR_DEPARTMENT_OTHER): Payer: Self-pay

## 2022-12-11 ENCOUNTER — Encounter: Payer: Self-pay | Admitting: Obstetrics & Gynecology

## 2022-12-11 VITALS — BP 133/71 | HR 79 | Ht 66.0 in | Wt 165.0 lb

## 2022-12-11 DIAGNOSIS — B009 Herpesviral infection, unspecified: Secondary | ICD-10-CM

## 2022-12-11 DIAGNOSIS — Z01419 Encounter for gynecological examination (general) (routine) without abnormal findings: Secondary | ICD-10-CM

## 2022-12-11 MED ORDER — VALACYCLOVIR HCL 500 MG PO TABS: ORAL_TABLET | ORAL | 11 refills | Status: DC

## 2022-12-11 MED ORDER — ZOSTER VAC RECOMB ADJUVANTED 50 MCG/0.5ML IM SUSR
0.5000 mL | Freq: Once | INTRAMUSCULAR | 0 refills | Status: AC
  Filled 2022-12-11: qty 0.5, 1d supply, fill #0

## 2022-12-11 NOTE — Progress Notes (Signed)
Subjective:     Jodi Cameron is a 67 y.o. female here for a routine exam.  Current complaints: Pain in left heel; s/p vescicals following and contined pain. Has seen derrm and podiatry. Pt wonders if this is shingles   Gynecologic History No LMP recorded. Patient has had a hysterectomy. Contraception: status post hysterectomy Last Pap: n/a.  Last mammogram: 11/27/2022. Results were: normal  Obstetric History OB History  Gravida Para Term Preterm AB Living  4 3 3   1 3   SAB IAB Ectopic Multiple Live Births  1       3    # Outcome Date GA Lbr Len/2nd Weight Sex Type Anes PTL Lv  4 Term 37 [redacted]w[redacted]d   M Vag-Spont EPI N LIV  3 Term 15 [redacted]w[redacted]d   F Vag-Spont EPI N LIV  2 Term 84 [redacted]w[redacted]d   F Vag-Spont None N LIV  1 SAB              The following portions of the patient's history were reviewed and updated as appropriate: allergies, current medications, past family history, past medical history, past social history, past surgical history, and problem list.  Review of Systems Pertinent items are noted in HPI.    Objective:  BP 133/71   Pulse 79   Ht 5\' 6"  (1.676 m)   Wt 165 lb (74.8 kg)   BMI 26.63 kg/m   General Appearance:    Alert, cooperative, no distress, appears stated age  Head:    Normocephalic, without obvious abnormality, atraumatic  Eyes:    conjunctiva/corneas clear, EOM's intact, both eyes  Ears:    Normal external ear canals, both ears  Nose:   Nares normal, septum midline, mucosa normal, no drainage    or sinus tenderness  Throat:   Lips, mucosa, and tongue normal; teeth and gums normal  Neck:   Supple, symmetrical, trachea midline, no adenopathy;    thyroid:  no enlargement/tenderness/nodules  Back:     Symmetric, no curvature, ROM normal, no CVA tenderness  Lungs:     respirations unlabored  Chest Wall:    No tenderness or deformity   Heart:    Regular rate and rhythm  Breast Exam:    No tenderness, masses, or nipple abnormality  Abdomen:     Soft, non-tender,  bowel sounds active all four quadrants,    no masses, no organomegaly  Genitalia:    Normal female without lesion, discharge or tenderness     Extremities:   Extremities normal, atraumatic, no cyanosis or edema  Pulses:   2+ and symmetric all extremities  Skin:   Skin color, texture, turgor normal, no rashes or lesions      Assessment:    Healthy female exam.  Rash on left heel. Follows a dermatome. Possibly related to shingles. Sx now resolved.    Plan:   Shigrex today  F/u vaccine in 3 months or sooner prn  Jodi Cameron, M.D., [place]

## 2022-12-23 ENCOUNTER — Encounter: Payer: Self-pay | Admitting: Obstetrics & Gynecology

## 2022-12-27 ENCOUNTER — Other Ambulatory Visit (HOSPITAL_BASED_OUTPATIENT_CLINIC_OR_DEPARTMENT_OTHER): Payer: Self-pay

## 2023-04-08 ENCOUNTER — Other Ambulatory Visit (HOSPITAL_BASED_OUTPATIENT_CLINIC_OR_DEPARTMENT_OTHER): Payer: Self-pay

## 2023-04-08 MED ORDER — ZOSTER VAC RECOMB ADJUVANTED 50 MCG/0.5ML IM SUSR
0.5000 mL | Freq: Once | INTRAMUSCULAR | 0 refills | Status: AC
  Filled 2023-04-08: qty 0.5, 1d supply, fill #0

## 2023-05-14 DIAGNOSIS — J910 Malignant pleural effusion: Secondary | ICD-10-CM

## 2023-05-14 DIAGNOSIS — C569 Malignant neoplasm of unspecified ovary: Secondary | ICD-10-CM

## 2023-05-14 HISTORY — DX: Malignant pleural effusion: J91.0

## 2023-05-14 HISTORY — DX: Malignant neoplasm of unspecified ovary: C56.9

## 2023-08-21 ENCOUNTER — Other Ambulatory Visit (HOSPITAL_BASED_OUTPATIENT_CLINIC_OR_DEPARTMENT_OTHER): Payer: Self-pay

## 2024-05-26 ENCOUNTER — Ambulatory Visit
Admission: EM | Admit: 2024-05-26 | Discharge: 2024-05-26 | Disposition: A | Discharge status: Home / Self Care | Attending: Family Medicine | Admitting: Family Medicine

## 2024-05-26 ENCOUNTER — Telehealth: Payer: Self-pay

## 2024-05-26 ENCOUNTER — Telehealth: Payer: Self-pay | Admitting: Family Medicine

## 2024-05-26 ENCOUNTER — Ambulatory Visit

## 2024-05-26 DIAGNOSIS — R059 Cough, unspecified: Secondary | ICD-10-CM | POA: Diagnosis not present

## 2024-05-26 DIAGNOSIS — R0602 Shortness of breath: Secondary | ICD-10-CM

## 2024-05-26 DIAGNOSIS — R051 Acute cough: Secondary | ICD-10-CM

## 2024-05-26 DIAGNOSIS — R079 Chest pain, unspecified: Secondary | ICD-10-CM | POA: Diagnosis not present

## 2024-05-26 DIAGNOSIS — J189 Pneumonia, unspecified organism: Secondary | ICD-10-CM | POA: Diagnosis not present

## 2024-05-26 DIAGNOSIS — J90 Pleural effusion, not elsewhere classified: Secondary | ICD-10-CM | POA: Diagnosis not present

## 2024-05-26 MED ORDER — HYDROCOD POLI-CHLORPHE POLI ER 10-8 MG/5ML PO SUER: 5.0000 mL | Freq: Two times a day (BID) | ORAL | 0 refills | Status: AC | PRN

## 2024-05-26 MED ORDER — AZITHROMYCIN 250 MG PO TABS: ORAL_TABLET | ORAL | 0 refills | Status: DC

## 2024-05-26 MED ORDER — AMOXICILLIN-POT CLAVULANATE 875-125 MG PO TABS: 1.0000 | ORAL_TABLET | Freq: Two times a day (BID) | ORAL | 0 refills | Status: DC

## 2024-05-26 NOTE — Telephone Encounter (Signed)
 LMOVM asking for CB for sooner appt per provider

## 2024-05-26 NOTE — ED Provider Notes (Signed)
 " Jodi Cameron    CSN: [id number] Arrival date & time: 05/26/24  9081      History   Chief Complaint Chief Complaint  Patient presents with   Cough   Nasal Congestion    HPI Jodi Cameron is a 69 y.o. female.   Patient does not have a regular medical provider.  She is not up-to-date on immunizations.  She is here because she has had a cough for 4 weeks.  She thought she was getting better but the last few days she has developed shortness of breath and pain in the right side of her chest.  She feels like she cannot take a deep breath.  She is feeling very tired.  She is here to make sure she does not have bronchitis or pneumonia.  Denies fever or chills.  No nausea vomiting.  She denies any underlying lung disease known although she was a smoker for many years.  Has never been told she has asthma or emphysema.  Has taken some over-the-counter medications, with some temporary relief but no resolution    History reviewed. No pertinent past medical history.  There are no active problems to display for this patient.   Past Surgical History:  Procedure Laterality Date   AUGMENTATION MAMMAPLASTY Bilateral    Subglandular, pt's second set of implants   BREAST ENHANCEMENT SURGERY     PARTIAL HYSTERECTOMY      OB History     Gravida  4   Para  3   Term  3   Preterm      AB  1   Living  3      SAB  1   IAB      Ectopic      Multiple      Live Births  3            Home Medications    Prior to Admission medications  Medication Sig Start Date End Date Taking? Authorizing Provider  amoxicillin -clavulanate (AUGMENTIN ) 875-125 MG tablet Take 1 tablet by mouth every 12 (twelve) hours. 05/26/24  Yes [name], MD  azithromycin  (ZITHROMAX  Z-PAK) 250 MG tablet Take two pills today followed by one a day until gone 05/26/24  Yes [name], MD  chlorpheniramine-HYDROcodone (TUSSIONEX) 10-8 MG/5ML Take 5 mLs by mouth every 12 (twelve) hours  as needed for cough. 05/26/24  Yes [name], MD  Cholecalciferol (VITAMIN D3) 1.25 MG (50000 UT) TABS Take by mouth.    [provider]  diphenhydrAMINE (BENADRYL) 25 MG tablet Take 25 mg by mouth every 6 (six) hours as needed.    [provider]  Multiple Vitamin (MULTIVITAMIN) tablet Take 1 tablet by mouth daily.    [provider]  Omega-3 Fatty Acids (FISH OIL) 1000 MG CAPS Take by mouth.    [provider]  pyridOXINE (B-6) 50 MG tablet Take 50 mg by mouth daily.    [provider]  Turmeric (QC TUMERIC COMPLEX) 500 MG CAPS Take by mouth.    [provider]  valACYclovir  (VALTREX ) 500 MG tablet Take 1 tablet by mouth twice a day for 3 days. 12/11/22   [name], MD  vitamin E 200 UNIT capsule Take 200 Units by mouth daily.    [provider]  Zinc 100 MG TABS Take by mouth.    [provider]    Family History Family History  Problem Relation Age of Onset   Alzheimer's disease Mother  Colon cancer Maternal Grandmother    Breast cancer Half-Sister     Social History Social History[1]   Allergies   Codeine   Review of Systems Review of Systems See HPI  Physical Exam Triage Vital Signs ED Triage Vitals [05/26/24 0937]  Encounter Vitals Group     BP 117/81     Girls Systolic BP Percentile      Girls Diastolic BP Percentile      Boys Systolic BP Percentile      Boys Diastolic BP Percentile      Pulse Rate (!) 123     Resp 17     Temp 98.2 F (36.8 C)     Temp Source Oral     SpO2 93 %     Weight      Height      Head Circumference      Peak Flow      Pain Score 4     Pain Loc      Pain Education      Exclude from Growth Chart    No data found.  Updated Vital Signs BP 117/81 (BP Location: Right Arm)   Pulse (!) 123   Temp 98.2 F (36.8 C) (Oral)   Resp 17   SpO2 93%        Physical Exam Constitutional:      General: She is not in acute distress.     Appearance: She is well-developed. She is ill-appearing.  HENT:     Head: Normocephalic and atraumatic.     Right Ear: Tympanic membrane normal.     Left Ear: Tympanic membrane normal.     Nose: Nose normal.     Mouth/Throat:     Mouth: Mucous membranes are moist.     Pharynx: No posterior oropharyngeal erythema.  Eyes:     Conjunctiva/sclera: Conjunctivae normal.     Pupils: Pupils are equal, round, and reactive to light.  Cardiovascular:     Rate and Rhythm: Normal rate.     Heart sounds: Normal heart sounds.  Pulmonary:     Effort: Pulmonary effort is normal.     Comments: Decreased breath sounds right lower and middle Abdominal:     General: There is no distension.     Palpations: Abdomen is soft.  Musculoskeletal:        General: Normal range of motion.     Cervical back: Normal range of motion.  Skin:    General: Skin is warm and dry.  Neurological:     Mental Status: She is alert.      UC Treatments / Results  Labs (all labs ordered are listed, but only abnormal results are displayed) Labs Reviewed - No data to display  EKG   Radiology DG Chest 2 View Result Date: 05/26/2024 CLINICAL DATA:  Cough for 4 weeks, right chest pain. EXAM: CHEST - 2 VIEW COMPARISON:  None Available. FINDINGS: Trachea is midline. Heart is at the upper limits of normal in size. Large right pleural effusion with collapse/consolidation in the right lung base. Left lung is clear. Degenerative changes in the spine. IMPRESSION: Large right pleural effusion with collapse/consolidation in the right lung base. Findings may be due to pneumonia with a parapneumonic effusion. Empyema cannot be excluded, nor can underlying malignancy. Followup PA and lateral chest X-ray is recommended in 3-4 weeks following trial of antibiotic therapy to ensure resolution and exclude underlying malignancy. Electronically Signed   By: Newell Eke M.D.   On: 05/26/2024  10:38    Procedures Procedures (including  critical Cameron time)  Medications Ordered in UC Medications - No data to display  Initial Impression / Assessment and Plan / UC Course  I have reviewed the triage vital signs and the nursing notes.  Pertinent labs & imaging results that were available during my Cameron of the patient were reviewed by me and considered in my medical decision making (see chart for details).     I called the PCP office to ask if they could see patient before February.  Initially got a response that they did not have any openings so I contacted the individual provider.  Her new PCP recommend she have a pulmonary referral and she will try to get her in sooner.  I did place a pulmonary referral for her. Final Clinical Impressions(s) / UC Diagnoses   Final diagnoses:  Acute cough  Pneumonia of right lower lobe due to infectious organism  Pleural effusion  SOB (shortness of breath)     Discharge Instructions      I have called the [place] office in [facility].  Someone should call you with a sooner appointment Make sure you are drinking lots of water.  Get plenty of rest.  Do not push yourself Take the Augmentin  antibiotic 2 times a day with food Take the Z-Pak as directed.  2 pills today then 1 a day until gone I have prescribed a stronger cough medicine to use at bedtime.  If it causes itching take it with Benadryl If you get worse at any time, go to the emergency room    ED Prescriptions     Medication Sig Dispense Auth. Provider   amoxicillin -clavulanate (AUGMENTIN ) 875-125 MG tablet Take 1 tablet by mouth every 12 (twelve) hours. 14 tablet [name], MD   azithromycin  (ZITHROMAX  Z-PAK) 250 MG tablet Take two pills today followed by one a day until gone 6 tablet [name], MD   chlorpheniramine-HYDROcodone (TUSSIONEX) 10-8 MG/5ML Take 5 mLs by mouth every 12 (twelve) hours as needed for cough. 115 mL [name], MD      I have reviewed the PDMP during this encounter.      [1]  Social History Tobacco Use   Smoking status: Former    Current packs/day: 0.00    Types: Cigarettes    Quit date: 11/16/2019    Years since quitting: 4.5   Smokeless tobacco: Never   Tobacco comments:    1 pack a week  Substance Use Topics   Alcohol use: Yes    Comment: weekend    Drug use: Not Currently     [name], MD 05/26/24 1333  "

## 2024-05-26 NOTE — Discharge Instructions (Addendum)
 I have called the [place] office in [facility].  Someone should call you with a sooner appointment Make sure you are drinking lots of water.  Get plenty of rest.  Do not push yourself Take the Augmentin  antibiotic 2 times a day with food Take the Z-Pak as directed.  2 pills today then 1 a day until gone I have prescribed a stronger cough medicine to use at bedtime.  If it causes itching take it with Benadryl If you get worse at any time, go to the emergency room

## 2024-05-26 NOTE — Telephone Encounter (Signed)
 I communicated with PCP office.  They request pulmonary referral.  Will place.

## 2024-05-26 NOTE — ED Triage Notes (Signed)
 Pt c/o cough and congestion x 4 weeks. Some chest discomfort due to coughing fits. Shortness of breath x 4 days. Nyquil and ibuprofen prn.

## 2024-05-28 ENCOUNTER — Encounter: Payer: Self-pay | Admitting: Student

## 2024-05-28 ENCOUNTER — Inpatient Hospital Stay (HOSPITAL_BASED_OUTPATIENT_CLINIC_OR_DEPARTMENT_OTHER)
Admission: EM | Admit: 2024-05-28 | Discharge: 2024-06-03 | DRG: 987 | Disposition: A | Discharge status: Home / Self Care | Attending: Internal Medicine | Admitting: Internal Medicine

## 2024-05-28 ENCOUNTER — Other Ambulatory Visit: Payer: Self-pay

## 2024-05-28 ENCOUNTER — Ambulatory Visit: Admitting: Student

## 2024-05-28 ENCOUNTER — Emergency Department (HOSPITAL_BASED_OUTPATIENT_CLINIC_OR_DEPARTMENT_OTHER)

## 2024-05-28 VITALS — BP 120/79 | HR 126 | Temp 98.1°F | Resp 22 | Ht 66.0 in | Wt 139.8 lb

## 2024-05-28 DIAGNOSIS — K5900 Constipation, unspecified: Secondary | ICD-10-CM | POA: Diagnosis present

## 2024-05-28 DIAGNOSIS — C778 Secondary and unspecified malignant neoplasm of lymph nodes of multiple regions: Secondary | ICD-10-CM | POA: Diagnosis not present

## 2024-05-28 DIAGNOSIS — F17210 Nicotine dependence, cigarettes, uncomplicated: Secondary | ICD-10-CM | POA: Diagnosis present

## 2024-05-28 DIAGNOSIS — Z885 Allergy status to narcotic agent status: Secondary | ICD-10-CM | POA: Diagnosis not present

## 2024-05-28 DIAGNOSIS — F32A Depression, unspecified: Secondary | ICD-10-CM | POA: Diagnosis present

## 2024-05-28 DIAGNOSIS — E871 Hypo-osmolality and hyponatremia: Secondary | ICD-10-CM | POA: Diagnosis present

## 2024-05-28 DIAGNOSIS — E43 Unspecified severe protein-calorie malnutrition: Secondary | ICD-10-CM | POA: Diagnosis present

## 2024-05-28 DIAGNOSIS — R188 Other ascites: Secondary | ICD-10-CM | POA: Diagnosis present

## 2024-05-28 DIAGNOSIS — G4700 Insomnia, unspecified: Secondary | ICD-10-CM | POA: Diagnosis present

## 2024-05-28 DIAGNOSIS — Z720 Tobacco use: Secondary | ICD-10-CM | POA: Diagnosis not present

## 2024-05-28 DIAGNOSIS — Z820 Family history of epilepsy and other diseases of the nervous system: Secondary | ICD-10-CM | POA: Diagnosis not present

## 2024-05-28 DIAGNOSIS — C770 Secondary and unspecified malignant neoplasm of lymph nodes of head, face and neck: Secondary | ICD-10-CM | POA: Diagnosis present

## 2024-05-28 DIAGNOSIS — Z6822 Body mass index (BMI) 22.0-22.9, adult: Secondary | ICD-10-CM

## 2024-05-28 DIAGNOSIS — D638 Anemia in other chronic diseases classified elsewhere: Secondary | ICD-10-CM | POA: Diagnosis present

## 2024-05-28 DIAGNOSIS — R1900 Intra-abdominal and pelvic swelling, mass and lump, unspecified site: Secondary | ICD-10-CM | POA: Diagnosis not present

## 2024-05-28 DIAGNOSIS — R599 Enlarged lymph nodes, unspecified: Secondary | ICD-10-CM | POA: Diagnosis not present

## 2024-05-28 DIAGNOSIS — R000 Tachycardia, unspecified: Secondary | ICD-10-CM | POA: Insufficient documentation

## 2024-05-28 DIAGNOSIS — R64 Cachexia: Secondary | ICD-10-CM | POA: Diagnosis present

## 2024-05-28 DIAGNOSIS — J910 Malignant pleural effusion: Secondary | ICD-10-CM | POA: Diagnosis present

## 2024-05-28 DIAGNOSIS — C55 Malignant neoplasm of uterus, part unspecified: Secondary | ICD-10-CM | POA: Diagnosis present

## 2024-05-28 DIAGNOSIS — C563 Malignant neoplasm of bilateral ovaries: Principal | ICD-10-CM | POA: Diagnosis present

## 2024-05-28 DIAGNOSIS — E8809 Other disorders of plasma-protein metabolism, not elsewhere classified: Secondary | ICD-10-CM | POA: Diagnosis present

## 2024-05-28 DIAGNOSIS — Z800 Family history of malignant neoplasm of digestive organs: Secondary | ICD-10-CM

## 2024-05-28 DIAGNOSIS — J90 Pleural effusion, not elsewhere classified: Principal | ICD-10-CM | POA: Diagnosis present

## 2024-05-28 DIAGNOSIS — J189 Pneumonia, unspecified organism: Secondary | ICD-10-CM | POA: Diagnosis not present

## 2024-05-28 DIAGNOSIS — D75839 Thrombocytosis, unspecified: Secondary | ICD-10-CM | POA: Diagnosis present

## 2024-05-28 DIAGNOSIS — J9819 Other pulmonary collapse: Secondary | ICD-10-CM | POA: Diagnosis present

## 2024-05-28 DIAGNOSIS — C786 Secondary malignant neoplasm of retroperitoneum and peritoneum: Secondary | ICD-10-CM | POA: Diagnosis not present

## 2024-05-28 DIAGNOSIS — N838 Other noninflammatory disorders of ovary, fallopian tube and broad ligament: Secondary | ICD-10-CM

## 2024-05-28 DIAGNOSIS — R0602 Shortness of breath: Secondary | ICD-10-CM | POA: Diagnosis present

## 2024-05-28 DIAGNOSIS — Z803 Family history of malignant neoplasm of breast: Secondary | ICD-10-CM | POA: Diagnosis not present

## 2024-05-28 DIAGNOSIS — R935 Abnormal findings on diagnostic imaging of other abdominal regions, including retroperitoneum: Secondary | ICD-10-CM | POA: Diagnosis not present

## 2024-05-28 DIAGNOSIS — R748 Abnormal levels of other serum enzymes: Secondary | ICD-10-CM | POA: Diagnosis present

## 2024-05-28 LAB — COMPREHENSIVE METABOLIC PANEL WITH GFR
ALT: 14 U/L (ref 0–44)
AST: 32 U/L (ref 15–41)
Albumin: 3.1 g/dL — ABNORMAL LOW (ref 3.5–5.0)
Alkaline Phosphatase: 157 U/L — ABNORMAL HIGH (ref 38–126)
Anion gap: 10 (ref 5–15)
BUN: 18 mg/dL (ref 8–23)
CO2: 26 mmol/L (ref 22–32)
Calcium: 9.3 mg/dL (ref 8.9–10.3)
Chloride: 96 mmol/L — ABNORMAL LOW (ref 98–111)
Creatinine, Ser: 0.61 mg/dL (ref 0.44–1.00)
GFR, Estimated: >60 mL/min
Glucose, Bld: 110 mg/dL — ABNORMAL HIGH (ref 70–99)
Potassium: 4.7 mmol/L (ref 3.5–5.1)
Sodium: 132 mmol/L — ABNORMAL LOW (ref 135–145)
Total Bilirubin: 0.2 mg/dL (ref 0.0–1.2)
Total Protein: 8.8 g/dL — ABNORMAL HIGH (ref 6.5–8.1)

## 2024-05-28 LAB — CBC WITH DIFFERENTIAL/PLATELET
Abs Immature Granulocytes: 0.04 K/uL (ref 0.00–0.07)
Basophils Absolute: 0.1 K/uL (ref 0.0–0.1)
Basophils Relative: 1 %
Eosinophils Absolute: 0.2 K/uL (ref 0.0–0.5)
Eosinophils Relative: 2 %
HCT: 39.2 % (ref 36.0–46.0)
Hemoglobin: 12.6 g/dL (ref 12.0–15.0)
Immature Granulocytes: 0 %
Lymphocytes Relative: 27 %
Lymphs Abs: 2.8 K/uL (ref 0.7–4.0)
MCH: 26.8 pg (ref 26.0–34.0)
MCHC: 32.1 g/dL (ref 30.0–36.0)
MCV: 83.2 fL (ref 80.0–100.0)
Monocytes Absolute: 1.1 K/uL — ABNORMAL HIGH (ref 0.1–1.0)
Monocytes Relative: 10 %
Neutro Abs: 6.4 K/uL (ref 1.7–7.7)
Neutrophils Relative %: 60 %
Platelets: 598 K/uL — ABNORMAL HIGH (ref 150–400)
RBC: 4.71 MIL/uL (ref 3.87–5.11)
RDW: 13.5 % (ref 11.5–15.5)
WBC: 10.5 K/uL (ref 4.0–10.5)
nRBC: 0 % (ref 0.0–0.2)

## 2024-05-28 LAB — LACTIC ACID, PLASMA: Lactic Acid, Venous: 1.3 mmol/L (ref 0.5–1.9)

## 2024-05-28 LAB — RESP PANEL BY RT-PCR (RSV, FLU A&B, COVID)  RVPGX2
Influenza A by PCR: NEGATIVE
Influenza B by PCR: NEGATIVE
Resp Syncytial Virus by PCR: NEGATIVE
SARS Coronavirus 2 by RT PCR: NEGATIVE

## 2024-05-28 LAB — PROTIME-INR
INR: 1.1 (ref 0.8–1.2)
Prothrombin Time: 14.3 s (ref 11.4–15.2)

## 2024-05-28 MED ORDER — HEPARIN SODIUM (PORCINE) 5000 UNIT/ML IJ SOLN
5000.0000 [IU] | Freq: Three times a day (TID) | INTRAMUSCULAR | Status: DC
  Administered 2024-05-29 – 2024-05-30 (×3): 5000 [IU] via SUBCUTANEOUS
  Filled 2024-05-28 (×4): qty 1

## 2024-05-28 MED ORDER — IPRATROPIUM-ALBUTEROL 0.5-2.5 (3) MG/3ML IN SOLN
3.0000 mL | Freq: Once | RESPIRATORY_TRACT | Status: AC
  Administered 2024-05-28: 3 mL via RESPIRATORY_TRACT
  Filled 2024-05-28: qty 3

## 2024-05-28 MED ORDER — ALBUTEROL SULFATE (2.5 MG/3ML) 0.083% IN NEBU: 2.5000 mg | INHALATION_SOLUTION | RESPIRATORY_TRACT | Status: DC | PRN

## 2024-05-28 MED ORDER — GUAIFENESIN 100 MG/5ML PO LIQD
5.0000 mL | ORAL | Status: DC | PRN
  Filled 2024-05-28: qty 10

## 2024-05-28 MED ORDER — SODIUM CHLORIDE 0.9 % IV SOLN
1.0000 g | INTRAVENOUS | Status: DC
  Administered 2024-05-29 – 2024-05-31 (×3): 1 g via INTRAVENOUS
  Filled 2024-05-28 (×3): qty 10

## 2024-05-28 MED ORDER — AZITHROMYCIN 250 MG PO TABS
500.0000 mg | ORAL_TABLET | Freq: Every day | ORAL | Status: DC
  Administered 2024-05-28 – 2024-06-01 (×5): 500 mg via ORAL
  Filled 2024-05-28 (×5): qty 2

## 2024-05-28 MED ORDER — SODIUM CHLORIDE 0.9 % IV BOLUS
1000.0000 mL | Freq: Once | INTRAVENOUS | Status: AC
  Administered 2024-05-28: 1000 mL via INTRAVENOUS

## 2024-05-28 MED ORDER — ONDANSETRON HCL 4 MG PO TABS: 4.0000 mg | ORAL_TABLET | Freq: Four times a day (QID) | ORAL | Status: DC | PRN

## 2024-05-28 MED ORDER — ONDANSETRON HCL 4 MG/2ML IJ SOLN
4.0000 mg | Freq: Four times a day (QID) | INTRAMUSCULAR | Status: DC | PRN
  Administered 2024-06-02: 4 mg via INTRAVENOUS
  Filled 2024-05-28: qty 2

## 2024-05-28 MED ORDER — ACETAMINOPHEN 650 MG RE SUPP: 650.0000 mg | Freq: Four times a day (QID) | RECTAL | Status: DC | PRN

## 2024-05-28 MED ORDER — ACETAMINOPHEN 325 MG PO TABS
650.0000 mg | ORAL_TABLET | Freq: Four times a day (QID) | ORAL | Status: DC | PRN
  Administered 2024-05-29 (×2): 650 mg via ORAL
  Filled 2024-05-28 (×2): qty 2

## 2024-05-28 MED ORDER — SODIUM CHLORIDE 0.9 % IV SOLN
1.0000 g | Freq: Once | INTRAVENOUS | Status: AC
  Administered 2024-05-28: 1 g via INTRAVENOUS
  Filled 2024-05-28: qty 10

## 2024-05-28 NOTE — ED Notes (Signed)
 [facility] called for transport.

## 2024-05-28 NOTE — Progress Notes (Signed)
 No chief complaint on file.      New Patient Visit SUBJECTIVE: HPI: Jodi Cameron is an 69 y.o.female who is being seen for establishing care and FU PNA  She was diagnosed with PNA by chest x-ray on 1/14 and started on a Z-Pak and Augmentin . She has had a persistent cough for four weeks, She is sleeping well. +SOB  She has a 40-year smoking history and quit abruptly two months ago. She smoked about a pack a day . She denies current tobacco use and drinks alcohol once or twice a month.  Family history is notable for a sister with breast cancer, a grandmother with colon cancer,  The patient was previously seen at: denies prior PCP  +SOB, +Dyspnea +Cough Denies fever, CP, abdominal pain, urinary symptoms.  History reviewed. No pertinent past medical history. Past Surgical History:  Procedure Laterality Date   AUGMENTATION MAMMAPLASTY Bilateral    Subglandular, pt's second set of implants   BREAST ENHANCEMENT SURGERY     let rotator cuff surgery  08/2022   PARTIAL HYSTERECTOMY     Family History  Problem Relation Age of Onset   Alzheimer's disease Mother    Colon cancer Maternal Grandmother    Breast cancer Half-Sister    Allergies[1] Current Medications[2]  PHQ9 Today:    05/28/2024    3:34 PM 12/11/2022    3:08 PM 06/29/2019    4:56 PM  Depression screen PHQ 2/9  Decreased Interest 3 1 1   Down, Depressed, Hopeless 2 1 1   PHQ - 2 Score 5 2 2   Altered sleeping 2 0 3  Tired, decreased energy 3 1 2   Change in appetite 2 0 3  Feeling bad or failure about yourself  0 0 0  Trouble concentrating 1 0 2  Moving slowly or fidgety/restless 2 0 0  Suicidal thoughts 0 0 0  PHQ-9 Score 15 3  12    Difficult doing work/chores   Somewhat difficult     Data saved with a previous flowsheet row definition   GAD7 Today:    12/11/2022    3:08 PM  GAD 7 : Generalized Anxiety Score  Nervous, Anxious, on Edge 0  Control/stop worrying 1  Worry too much - different things 1  Trouble  relaxing 0  Restless 0  Easily annoyed or irritable 1  Afraid - awful might happen 0  Total GAD 7 Score 3    OBJECTIVE: BP 120/79   Pulse (!) 126   Temp 98.1 F (36.7 C)   Resp (!) 22   Ht 5' 6 (1.676 m)   Wt 139 lb 12.8 oz (63.4 kg)   SpO2 92%   BMI 22.56 kg/m  Physical Exam Constitutional:      General: She is not in acute distress. +frail    Appearance: She is well-developed. She is ill appearing +increased WOB at rest HENT:     Head: Normocephalic and atraumatic.  Eyes:     Conjunctiva/sclera: Conjunctivae normal.     Pupils: Pupils are equal, round, and reactive to light.  Cardiovascular:     Rate and Rhythm: Normal rate.     Heart sounds: Normal heart sounds.  Pulmonary:     Effort: Pulmonary effort is normal.     Comments: Decreased breath sounds Right Lung, Left lung clear Musculoskeletal:        General: Normal range of motion.     Cervical back: Normal range of motion.  Skin:    General: Skin is warm and  dry.  Neurological:     Mental Status: She is alert. Cooperative.   ASSESSMENT/PLAN: Shortness of breath  Pneumonia of right lower lobe due to infectious organism  Tachycardia, unspecified  The patient was recently diagnosed with right lower lobe pneumonia at urgent care and continues to have significant shortness of breath. During todays office visit, she exhibited increased work of breathing, tachycardia, ill appearance and ED evaluation was recommended. The case was discussed with the ED provider at [facility], patient was brought to [facility] ED via wheelchair by CMA, Amber.    No follow-ups on file.   Harlene LITTIE Jolly, DNP, AGNP-C 05/28/24  4:27 PM      [1]  Allergies Allergen Reactions   Codeine Itching  [2]  Current Outpatient Medications:    amoxicillin -clavulanate (AUGMENTIN ) 875-125 MG tablet, Take 1 tablet by mouth every 12 (twelve) hours., Disp: 14 tablet, Rfl: 0   azithromycin  (ZITHROMAX  Z-PAK) 250 MG tablet, Take two pills today  followed by one a day until gone, Disp: 6 tablet, Rfl: 0   chlorpheniramine-HYDROcodone  (TUSSIONEX) 10-8 MG/5ML, Take 5 mLs by mouth every 12 (twelve) hours as needed for cough., Disp: 115 mL, Rfl: 0   Cholecalciferol (VITAMIN D3) 1.25 MG (50000 UT) TABS, Take by mouth., Disp: , Rfl:    diphenhydrAMINE (BENADRYL) 25 MG tablet, Take 25 mg by mouth every 6 (six) hours as needed., Disp: , Rfl:    Multiple Vitamin (MULTIVITAMIN) tablet, Take 1 tablet by mouth daily., Disp: , Rfl:    Omega-3 Fatty Acids (FISH OIL) 1000 MG CAPS, Take by mouth., Disp: , Rfl:    pyridOXINE (B-6) 50 MG tablet, Take 50 mg by mouth daily., Disp: , Rfl:    Turmeric (QC TUMERIC COMPLEX) 500 MG CAPS, Take by mouth., Disp: , Rfl:    valACYclovir  (VALTREX ) 500 MG tablet, Take 1 tablet by mouth twice a day for 3 days., Disp: 6 tablet, Rfl: 11   vitamin E 200 UNIT capsule, Take 200 Units by mouth daily., Disp: , Rfl:    Zinc 100 MG TABS, Take by mouth., Disp: , Rfl:

## 2024-05-28 NOTE — ED Provider Notes (Signed)
 " [place] [facility] AT [facility] Provider Note   CSN: [id number] Arrival date & time: 05/28/24  1617     Patient presents with: Shortness of Breath   Jodi Cameron is a 69 y.o. female.   This is a 69 year old female presenting emergency department for cough.  Reports not feeling well for roughly a month, has been having a productive cough.  Went to urgent care was diagnosed with right-sided pneumonia and given azithromycin  and Augmentin .  Went to primary doctor who noted tachycardia and tachypnea and sent to the emergency department for further evaluation.  Patient does state that she feels short of breath, but not having chest pain lightheadedness or dizziness.  Has had some decreased p.o. intake the past few days as well due to generalized malaise.  But no abdominal pain.   Shortness of Breath      Prior to Admission medications  Medication Sig Start Date End Date Taking? Authorizing Provider  amoxicillin -clavulanate (AUGMENTIN ) 875-125 MG tablet Take 1 tablet by mouth every 12 (twelve) hours. 05/26/24   [name], MD  azithromycin  (ZITHROMAX  Z-PAK) 250 MG tablet Take two pills today followed by one a day until gone 05/26/24   [name], MD  chlorpheniramine-HYDROcodone  (TUSSIONEX) 10-8 MG/5ML Take 5 mLs by mouth every 12 (twelve) hours as needed for cough. 05/26/24   [name], MD  Cholecalciferol (VITAMIN D3) 1.25 MG (50000 UT) TABS Take by mouth.    [provider]  diphenhydrAMINE (BENADRYL) 25 MG tablet Take 25 mg by mouth every 6 (six) hours as needed.    [provider]  Multiple Vitamin (MULTIVITAMIN) tablet Take 1 tablet by mouth daily.    [provider]  Omega-3 Fatty Acids (FISH OIL) 1000 MG CAPS Take by mouth.    [provider]  pyridOXINE (B-6) 50 MG tablet Take 50 mg by mouth daily.    [provider]  Turmeric (QC TUMERIC COMPLEX) 500 MG CAPS Take by mouth.    [provider]  valACYclovir  (VALTREX ) 500 MG tablet Take 1 tablet by mouth twice a day for 3 days. 12/11/22   [name], MD  vitamin E 200 UNIT capsule Take 200 Units by mouth daily.    [provider]  Zinc 100 MG TABS Take by mouth.    [provider]    Allergies: Codeine    Review of Systems  Respiratory:  Positive for shortness of breath.     Updated Vital Signs BP (!) 148/95   Pulse (!) 109   Temp 98.4 F (36.9 C) (Oral)   Resp (!) 22   Ht 5' 6 (1.676 m)   Wt 63.5 kg   SpO2 95%   BMI 22.60 kg/m   Physical Exam Vitals and nursing note reviewed.  Constitutional:      General: She is not in acute distress.    Appearance: She is ill-appearing.  HENT:     Head: Normocephalic.  Cardiovascular:     Rate and Rhythm: Regular rhythm. Tachycardia present.  Pulmonary:     Effort: Tachypnea present.     Breath sounds: Examination of the right-upper field reveals decreased breath sounds. Examination of the right-middle field reveals decreased breath sounds. Examination of the right-lower field reveals decreased breath sounds. Decreased breath sounds present. No wheezing, rhonchi or rales.  Chest:     Chest wall: No tenderness.  Musculoskeletal:     Cervical back: Normal range of motion.  Right lower leg: No edema.     Left lower leg: No edema.  Skin:    General: Skin is warm.     Capillary Refill: Capillary refill takes less than 2 seconds.  Neurological:     Mental Status: She is oriented to person, place, and time.  Psychiatric:        Mood and Affect: Mood normal.        Behavior: Behavior normal.     (all labs ordered are listed, but only abnormal results are displayed) Labs Reviewed  COMPREHENSIVE METABOLIC PANEL WITH GFR - Abnormal; Notable for the following components:      Result Value   Sodium 132 (*)    Chloride 96 (*)    Glucose, Bld 110 (*)    Total Protein 8.8 (*)    Albumin 3.1 (*)    Alkaline Phosphatase 157 (*)     All other components within normal limits  CBC WITH DIFFERENTIAL/PLATELET - Abnormal; Notable for the following components:   Platelets 598 (*)    Monocytes Absolute 1.1 (*)    All other components within normal limits  CULTURE, BLOOD (ROUTINE X 2)  RESP PANEL BY RT-PCR (RSV, FLU A&B, COVID)  RVPGX2  CULTURE, BLOOD (ROUTINE X 2)  LACTIC ACID, PLASMA  PROTIME-INR  LACTIC ACID, PLASMA  URINALYSIS, W/ REFLEX TO CULTURE (INFECTION SUSPECTED)    EKG: None  Radiology: CT Chest Wo Contrast Result Date: 05/28/2024 EXAM: CT CHEST WITHOUT CONTRAST 05/28/2024 06:23:00 PM TECHNIQUE: CT of the chest was performed without the administration of intravenous contrast. Multiplanar reformatted images are provided for review. Automated exposure control, iterative reconstruction, and/or weight based adjustment of the mA/kV was utilized to reduce the radiation dose to as low as reasonably achievable. COMPARISON: 10/26/2024, 05/26/2024 CLINICAL HISTORY: Pleural effusion, malignancy suspected. FINDINGS: MEDIASTINUM: Heart and pericardium are unremarkable. The central airways are clear. Significant mass effect and rightward shift of the cardiomediastinal structures due to the pleural effusion. Enlarged left lower neck lymph nodes with stippled calcification throughout, the largest measuring 3.5 cm in transverse dimension (axial 14). These lymph nodes cause significant mass effect, rightward shift, and severe narrowing of the trachea of the thoracic inlet. LYMPH NODES: Multiple enlarged mediastinal lymph nodes. For example, there is a prevascular lymph node measuring 1.5 cm (axial 34), containing stippled calcification. Multiple enlarged AP window lymph nodes measuring up to 2 cm (axial 57). Clustered enlarged retrocrural lymph nodes measuring up to 1.7 cm on the right (axial 117). Cardiophrenic lymph nodes are also prominent measuring up to 8 mm (axial 95). Enlarged superior left retroperitoneal lymph node measuring up  to 1.6 cm (axial 145). LUNGS AND PLEURA: Redemonstrated large right pleural effusion with complete collapse of the right middle and right lower lobes. Near complete collapse of the right upper lobe is also present. No pneumothorax. SOFT TISSUES/BONES: Multilevel thoracic osteophytosis. Peripherally calcified bilateral breast implants. Findings suggestive of intracapsular rupture noted. UPPER ABDOMEN: Limited images of the upper abdomen demonstrates no acute abnormality. Enlarged superior left retroperitoneal lymph node measuring up to 1.6 cm (axial 145). IMPRESSION: 1. Large right pleural effusion with complete collapse of the right middle and right lower lobes, near complete collapse of the right upper lobe, and significant mass effect and rightward shift of the cardiomediastinal structures. Underlying pulmonary neoplasm or hilar neoplasm is difficult to exclude given the lack of intravenous contrast. 2. Enlarged left lower neck lymph nodes with stippled calcification causing significant mass effect, rightward shift, and severe narrowing of the  trachea at the thoracic inlet. This is consistent with metastatic disease. Clinical correlation for patient's respiratory status requested. 3. Extensive lymphadenopathy involving the mediastinum, retrocrural region, and superior left retroperitoneum, some with stippled calcification, consistent with either metastatic lymphadenopathy or underlying lymphoma. Electronically signed by: Rogelia Myers MD 05/28/2024 06:54 PM EST RP Workstation: [id number]   DG Chest Port 1 View Result Date: 05/28/2024 CLINICAL DATA:  Shortness of breath and cough EXAM: PORTABLE CHEST 1 VIEW COMPARISON:  05/28/2024 FINDINGS: Large right-sided pleural effusion, increased compared to prior. Underlying airspace disease. Stable cardiomediastinal silhouette with aortic atherosclerosis. No pneumothorax. Bilateral breast implants. IMPRESSION: Large right-sided pleural effusion, increased compared to  prior. Underlying pneumonia or mass not excluded. Electronically Signed   By: Luke Bun M.D.   On: 05/28/2024 17:56     Procedures   Medications Ordered in the ED  azithromycin  (ZITHROMAX ) tablet 500 mg (500 mg Oral Given 05/28/24 1827)  sodium chloride  0.9 % bolus 1,000 mL (0 mLs Intravenous Stopped 05/28/24 1910)  ipratropium-albuterol  (DUONEB) 0.5-2.5 (3) MG/3ML nebulizer solution 3 mL (3 mLs Nebulization Given 05/28/24 1703)  cefTRIAXone  (ROCEPHIN ) 1 g in sodium chloride  0.9 % 100 mL IVPB (0 g Intravenous Stopped 05/28/24 1900)    Clinical Course as of 05/28/24 2308  Fri May 28, 2024  1709 XR done at urgent care a few days ago per my chart review: MPRESSION:  Large right pleural effusion with collapse/consolidation in the  right lung base. Findings may be due to pneumonia with a  parapneumonic effusion. Empyema cannot be excluded, nor can  underlying malignancy. Followup PA and lateral chest X-ray is  recommended in 3-4 weeks following trial of antibiotic therapy to  ensure resolution and exclude underlying malignancy.   [TY]  1749 WBC: 10.5 [TY]  1749 Lactic Acid, Venous: 1.3 [TY]  1810 DG Chest Port 1 View IMPRESSION: Large right-sided pleural effusion, increased compared to prior. Underlying pneumonia or mass not excluded.   Electronically Signed   [TY]  1810 Does not appear to be overtly septic with no leukocytosis elevated lactate.  However chest x-ray with large effusion, possibly underlying pneumonia.  Given her persistent tachycardia and tachypnea, will cover with antibiotics.  CT chest ordered to evaluate for possible underlying mass [TY]  1910 CT Chest Wo Contrast IMPRESSION: 1. Large right pleural effusion with complete collapse of the right middle and right lower lobes, near complete collapse of the right upper lobe, and significant mass effect and rightward shift of the cardiomediastinal structures. Underlying pulmonary neoplasm or hilar neoplasm is difficult  to exclude given the lack of intravenous contrast. 2. Enlarged left lower neck lymph nodes with stippled calcification causing significant mass effect, rightward shift, and severe narrowing of the trachea at the thoracic inlet. This is consistent with metastatic disease. Clinical correlation for patient's respiratory status requested. 3. Extensive lymphadenopathy involving the mediastinum, retrocrural region, and superior left retroperitoneum, some with stippled calcification, consistent with either metastatic lymphadenopathy or underlying lymphoma.  Electronically signed by: Rogelia Myers MD 05/28/2024 06:54 PM EST RP Workstation: [id number]   [TY]    Clinical Course User Index [TY] Neysa Caron PARAS, DO                                 Medical Decision Making This is a 69 year old female presenting emergency department for evaluation of tachycardia and respiratory distress.  Recently diagnosed with pneumonia at urgent care per chart review and was placed  on Z-Pak and Augmentin .  Noted to be tachycardic at PCP visit.  On arrival heart rate 126.  Tachypneic, but maintaining oxygen saturation on room air.  Mild respiratory distress, but speaking in full sentences.  Decreased breath sounds on the right.  Benign abdominal exam.  No lower extremity edema.  She reports former history of smoking, no lung disease or diagnosed COPD/asthma.  Given her diagnosed pneumonia with her tachycardia sepsis screening labs ordered.  Will give fluids.  Will also give DuoNeb with the hope that it may help her shortness of breath.  Placed on 2 L nasal cannula for comfort secondary to her her tachypnea/mild work of breathing.  See ED course for further MDM and disposition.  Anticipate admission for failure of outpatient antibiotics.  Update; workup with out overt sepsis.  Large right-sided pleural effusion, possible underlying malignancy.  Persistently tachycardic despite fluids.  Will admit for thoracentesis and  further workup of possible lung cancer.  Case discussed with hospitalist, Dr. Charlton who agrees to admit patient.  Amount and/or Complexity of Data Reviewed Labs: ordered. Decision-making details documented in ED Course. Radiology: ordered. Decision-making details documented in ED Course.  Risk Prescription drug management. Decision regarding hospitalization.       Final diagnoses:  Pleural effusion    ED Discharge Orders     None          Neysa Caron PARAS, DO 05/28/24 2308  "

## 2024-05-28 NOTE — ED Notes (Signed)
 4th floor at [facility] called and informed carelink is here to get pt.

## 2024-05-28 NOTE — H&P (Signed)
 " History and Physical    Jodi Cameron FMW:[id number] DOB: 1955-06-20 DOA: 05/28/2024  PCP: Wheeler Harlene CROME, NP  Patient coming from: home  I have personally briefly reviewed patient's old medical records in [facility]  Chief Complaint: sob  HPI: Jodi Cameron is a 69 y.o. 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  Patient also has interim history of diagnosis of pneumonia in UC for which she was given augmentin  and zpack. Patient states symptoms persisted and she followed up with pcp. AT that time patient was note to be tachypneic , tachycardia and was sent to ED for evaluation.  Patient notes no fever, but dose have chills. Patient also noted cough.  + weight loss but notes this has been intentional over the last year. She notes no n/v/d/ or abdominal pain. She recently quit smoking.   ED Course: IN ED on evaluation patient was found to have large right sided pleural effusion, with concern for underlying mass.   Afeb , bp 120/79, hr 126, rr 22, sat 92%  Wbc 10.5, hgb 12.6, plt 598,  Na 132, K 4.40m cl 96, glu 110 cr 0.61 EKG: sinus tachycardia , RAD , q in anterior leads Lactic 1.3  RVP:neg Cxr IMPRESSION: Large right-sided pleural effusion, increased compared to prior. Underlying pneumonia or mass not excluded.  chest ct MPRESSION: 1. Large right pleural effusion with complete collapse of the right middle and right lower lobes, near complete collapse of the right upper lobe, and significant mass effect and rightward shift of the cardiomediastinal structures. Underlying pulmonary neoplasm or hilar neoplasm is difficult to exclude given the lack of intravenous contrast. 2. Enlarged left lower neck lymph nodes with stippled calcification causing significant mass effect, rightward shift, and severe narrowing of the trachea at the thoracic inlet. This is consistent with metastatic disease. Clinical correlation for patient's respiratory status  requested. 3. Extensive lymphadenopathy involving the mediastinum, retrocrural region, and superior left retroperitoneum, some with stippled calcification, consistent with either metastatic lymphadenopathy or underlying lymphoma. EKG: Review of Systems: As per HPI otherwise 10 point review of systems negative.   No past medical history on file.  Past Surgical History:  Procedure Laterality Date   AUGMENTATION MAMMAPLASTY Bilateral    Subglandular, pt's second set of implants   BREAST ENHANCEMENT SURGERY     let rotator cuff surgery  08/2022   PARTIAL HYSTERECTOMY       reports that she quit smoking about 40 years ago. Her smoking use included cigarettes. She has never used smokeless tobacco. She reports current alcohol use. She reports that she does not currently use drugs.  Allergies[1]  Family History  Problem Relation Age of Onset   Alzheimer's disease Mother    Colon cancer Maternal Grandmother    Breast cancer Half-Sister     Prior to Admission medications  Medication Sig Start Date End Date Taking? Authorizing Provider  amoxicillin -clavulanate (AUGMENTIN ) 875-125 MG tablet Take 1 tablet by mouth every 12 (twelve) hours. 05/26/24   [name], MD  azithromycin  (ZITHROMAX  Z-PAK) 250 MG tablet Take two pills today followed by one a day until gone 05/26/24   [name], MD  chlorpheniramine-HYDROcodone  (TUSSIONEX) 10-8 MG/5ML Take 5 mLs by mouth every 12 (twelve) hours as needed for cough. 05/26/24   [name], MD  Cholecalciferol (VITAMIN D3) 1.25 MG (50000 UT) TABS Take by mouth.    [provider]  diphenhydrAMINE (BENADRYL) 25 MG tablet Take 25 mg  by mouth every 6 (six) hours as needed.    [provider]  Multiple Vitamin (MULTIVITAMIN) tablet Take 1 tablet by mouth daily.    [provider]  Omega-3 Fatty Acids (FISH OIL) 1000 MG CAPS Take by mouth.    [provider]  pyridOXINE (B-6) 50 MG tablet Take 50 mg by  mouth daily.    [provider]  Turmeric (QC TUMERIC COMPLEX) 500 MG CAPS Take by mouth.    [provider]  valACYclovir  (VALTREX ) 500 MG tablet Take 1 tablet by mouth twice a day for 3 days. 12/11/22   [name], MD  vitamin E 200 UNIT capsule Take 200 Units by mouth daily.    [provider]  Zinc 100 MG TABS Take by mouth.    [provider]    Physical Exam: Vitals:   05/28/24 2000 05/28/24 2100 05/28/24 2212 05/28/24 2300  BP: (!) 141/94   (!) 148/95  Pulse: (!) 118 (!) 110  (!) 109  Resp: (!) 21 (!) 21  (!) 22  Temp:   98.4 F (36.9 C)   TempSrc:   Oral   SpO2: 93% 96%  95%  Weight:      Height:        Constitutional: NAD, calm, comfortable Vitals:   05/28/24 2000 05/28/24 2100 05/28/24 2212 05/28/24 2300  BP: (!) 141/94   (!) 148/95  Pulse: (!) 118 (!) 110  (!) 109  Resp: (!) 21 (!) 21  (!) 22  Temp:   98.4 F (36.9 C)   TempSrc:   Oral   SpO2: 93% 96%  95%  Weight:      Height:       Eyes: PERRL, lids and conjunctivae normal ENMT: Mucous membranes are moist. Posterior pharynx clear of any exudate or lesions.Normal dentition.  Neck: normal, supple, no masses, no thyromegaly Respiratory: clear to auscultation except no bs on right , no wheezing, no crackles. Normal respiratory effort. No accessory muscle use.  Cardiovascular: Regular rate and rhythm, no murmurs / rubs / gallops. No extremity edema. 2+ pedal pulses. Abdomen: no tenderness, no masses palpated. No hepatosplenomegaly. Bowel sounds positive.  Musculoskeletal: no clubbing / cyanosis. No joint deformity upper and lower extremities. Good ROM, no contractures. Normal muscle tone.  Skin: no rashes, lesions, ulcers. No induration Neurologic: CN 2-12 grossly intact. Sensation intact, Strength 5/5 in all 4.  Psychiatric: Normal judgment and insight. Alert and oriented x 3. Normal mood.    Labs on Admission: I have personally reviewed following labs and imaging  studies  CBC: Recent Labs  Lab 05/28/24 1629  WBC 10.5  NEUTROABS 6.4  HGB 12.6  HCT 39.2  MCV 83.2  PLT 598*   Basic Metabolic Panel: Recent Labs  Lab 05/28/24 1629  NA 132*  K 4.7  CL 96*  CO2 26  GLUCOSE 110*  BUN 18  CREATININE 0.61  CALCIUM 9.3   GFR: Estimated Creatinine Clearance: 64.8 mL/min (by C-G formula based on SCr of 0.61 mg/dL). Liver Function Tests: Recent Labs  Lab 05/28/24 1629  AST 32  ALT 14  ALKPHOS 157*  BILITOT 0.2  PROT 8.8*  ALBUMIN 3.1*   No results for input(s): LIPASE, AMYLASE in the last 168 hours. No results for input(s): AMMONIA in the last 168 hours. Coagulation Profile: Recent Labs  Lab 05/28/24 1629  INR 1.1   Cardiac Enzymes: No results for input(s): CKTOTAL, CKMB, CKMBINDEX, TROPONINI in the last 168 hours. BNP (last 3  results) No results for input(s): PROBNP in the last 8760 hours. HbA1C: No results for input(s): HGBA1C in the last 72 hours. CBG: No results for input(s): GLUCAP in the last 168 hours. Lipid Profile: No results for input(s): CHOL, HDL, LDLCALC, TRIG, CHOLHDL, LDLDIRECT in the last 72 hours. Thyroid Function Tests: No results for input(s): TSH, T4TOTAL, FREET4, T3FREE, THYROIDAB in the last 72 hours. Anemia Panel: No results for input(s): VITAMINB12, FOLATE, FERRITIN, TIBC, IRON, RETICCTPCT in the last 72 hours. Urine analysis: No results found for: COLORURINE, APPEARANCEUR, LABSPEC, PHURINE, GLUCOSEU, HGBUR, BILIRUBINUR, KETONESUR, PROTEINUR, UROBILINOGEN, NITRITE, LEUKOCYTESUR  Radiological Exams on Admission: CT Chest Wo Contrast Result Date: 05/28/2024 EXAM: CT CHEST WITHOUT CONTRAST 05/28/2024 06:23:00 PM TECHNIQUE: CT of the chest was performed without the administration of intravenous contrast. Multiplanar reformatted images are provided for review. Automated exposure control, iterative reconstruction, and/or weight  based adjustment of the mA/kV was utilized to reduce the radiation dose to as low as reasonably achievable. COMPARISON: 10/26/2024, 05/26/2024 CLINICAL HISTORY: Pleural effusion, malignancy suspected. FINDINGS: MEDIASTINUM: Heart and pericardium are unremarkable. The central airways are clear. Significant mass effect and rightward shift of the cardiomediastinal structures due to the pleural effusion. Enlarged left lower neck lymph nodes with stippled calcification throughout, the largest measuring 3.5 cm in transverse dimension (axial 14). These lymph nodes cause significant mass effect, rightward shift, and severe narrowing of the trachea of the thoracic inlet. LYMPH NODES: Multiple enlarged mediastinal lymph nodes. For example, there is a prevascular lymph node measuring 1.5 cm (axial 34), containing stippled calcification. Multiple enlarged AP window lymph nodes measuring up to 2 cm (axial 57). Clustered enlarged retrocrural lymph nodes measuring up to 1.7 cm on the right (axial 117). Cardiophrenic lymph nodes are also prominent measuring up to 8 mm (axial 95). Enlarged superior left retroperitoneal lymph node measuring up to 1.6 cm (axial 145). LUNGS AND PLEURA: Redemonstrated large right pleural effusion with complete collapse of the right middle and right lower lobes. Near complete collapse of the right upper lobe is also present. No pneumothorax. SOFT TISSUES/BONES: Multilevel thoracic osteophytosis. Peripherally calcified bilateral breast implants. Findings suggestive of intracapsular rupture noted. UPPER ABDOMEN: Limited images of the upper abdomen demonstrates no acute abnormality. Enlarged superior left retroperitoneal lymph node measuring up to 1.6 cm (axial 145). IMPRESSION: 1. Large right pleural effusion with complete collapse of the right middle and right lower lobes, near complete collapse of the right upper lobe, and significant mass effect and rightward shift of the cardiomediastinal structures.  Underlying pulmonary neoplasm or hilar neoplasm is difficult to exclude given the lack of intravenous contrast. 2. Enlarged left lower neck lymph nodes with stippled calcification causing significant mass effect, rightward shift, and severe narrowing of the trachea at the thoracic inlet. This is consistent with metastatic disease. Clinical correlation for patient's respiratory status requested. 3. Extensive lymphadenopathy involving the mediastinum, retrocrural region, and superior left retroperitoneum, some with stippled calcification, consistent with either metastatic lymphadenopathy or underlying lymphoma. Electronically signed by: Rogelia Myers MD 05/28/2024 06:54 PM EST RP Workstation: [id number]   DG Chest Port 1 View Result Date: 05/28/2024 CLINICAL DATA:  Shortness of breath and cough EXAM: PORTABLE CHEST 1 VIEW COMPARISON:  05/28/2024 FINDINGS: Large right-sided pleural effusion, increased compared to prior. Underlying airspace disease. Stable cardiomediastinal silhouette with aortic atherosclerosis. No pneumothorax. Bilateral breast implants. IMPRESSION: Large right-sided pleural effusion, increased compared to prior. Underlying pneumonia or mass not excluded. Electronically Signed   By: Luke Bun M.D.   On: 05/28/2024 17:56  EKG: Independently reviewed.  Assessment/Plan Large Right sided pleural effusion presumed malignant -Enlarged left lower neck lymph nodes with stippled calcification  -lymph nodes causing significant mass effect, rightward shift -severe narrowing of the trachea at the thoracic inlet. C/w metastatic disease.  -please call pulmonary/oncology consult in am  - supportive care with O2 as needed  - pulmonary toilet   Possible post obstructive Pneumonia  -continue on ctx/azithromycin  -de-escalate as able   New Malignancy primary ? Lymphoma  -will need oncology consult in am   Tobacco abuse  -encourage cessation   Mild hyponatremia -continue to monitor  DVT  prophylaxis: heparin  Code Status: full/ as discussed per patient wishes in event of cardiac arrest  Family Communication:  none at bedside Disposition Plan: patient  expected to be admitted greater than 2 midnights  Consults called: pulmonary  Admission status: progressive care   Camila DELENA Ned MD [facility]  If 7PM-7AM, please contact night-coverage [email or web address] Password [name]  05/28/2024, 11:51 PM        [1]  Allergies Allergen Reactions   Codeine Itching   "

## 2024-05-28 NOTE — Progress Notes (Signed)
 Plan of Care Note for accepted transfer   Patient: Jodi Cameron MRN: [id number]   DOA: 05/28/2024  Facility requesting transfer: [facility]   Requesting Provider: Dr. Neysa   Reason for transfer: Large right pleural effusion   Facility course: 69 yr old female who recently quit smoking presents with worsening SOB and productive cough despite recent treatment with antibiotics for suspected pneumonia.   She is found on CT to have large right pleural effusion and adenopathy concerning for metastatic disease or lymphoma.   She was given IVF, nebs, and antibiotics.   Plan of care: The patient is accepted for admission to Progressive unit, at [facility].   Author: Evalene GORMAN Sprinkles, MD 05/28/2024  Check [email or web address] for on-call coverage.  Nursing staff, Please call [name] Admits & Consults System-Wide number on Amion as soon as patient's arrival, so appropriate admitting provider can evaluate the pt.

## 2024-05-28 NOTE — H&P (Incomplete)
 " History and Physical    Jodi Cameron:[id number] DOB: 07-20-1955 DOA: 05/28/2024  PCP: Wheeler Harlene CROME, NP  Patient coming from: home  I have personally briefly reviewed patient's old medical records in [facility]  Chief Complaint: sob  HPI: Jodi Cameron is a 69 y.o. 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  Patient also has interim history of diagnosis of pneumonia in UC for which she was given augmentin  and zpack. Patient states symptoms persisted and she followed up with pcp. AT that time patient was note to be tachypneic , tachycardia and was sent to ED for evaluation.    ED Course: IN ED on evaluation patient was found to have large right sided pleural effusion, with concern for underlying mass.   Afeb , bp 120/79, hr 126, rr 22, sat 92%  Wbc 10.5, hgb 12.6, plt 598,  Na 132, K 4.26m cl 96, glu 110 cr 0.61 EKG: sinus tachycardia , RAD , q in anterior leads Lactic 1.3  RVP:neg Cxr IMPRESSION: Large right-sided pleural effusion, increased compared to prior. Underlying pneumonia or mass not excluded.  chest ct MPRESSION: 1. Large right pleural effusion with complete collapse of the right middle and right lower lobes, near complete collapse of the right upper lobe, and significant mass effect and rightward shift of the cardiomediastinal structures. Underlying pulmonary neoplasm or hilar neoplasm is difficult to exclude given the lack of intravenous contrast. 2. Enlarged left lower neck lymph nodes with stippled calcification causing significant mass effect, rightward shift, and severe narrowing of the trachea at the thoracic inlet. This is consistent with metastatic disease. Clinical correlation for patient's respiratory status requested. 3. Extensive lymphadenopathy involving the mediastinum, retrocrural region, and superior left retroperitoneum, some with stippled calcification, consistent with either metastatic lymphadenopathy or  underlying lymphoma. EKG: Review of Systems: As per HPI otherwise 10 point review of systems negative.   No past medical history on file.  Past Surgical History:  Procedure Laterality Date   AUGMENTATION MAMMAPLASTY Bilateral    Subglandular, pt's second set of implants   BREAST ENHANCEMENT SURGERY     let rotator cuff surgery  08/2022   PARTIAL HYSTERECTOMY       reports that she quit smoking about 40 years ago. Her smoking use included cigarettes. She has never used smokeless tobacco. She reports current alcohol use. She reports that she does not currently use drugs.  Allergies[1]  Family History  Problem Relation Age of Onset   Alzheimer's disease Mother    Colon cancer Maternal Grandmother    Breast cancer Half-Sister     Prior to Admission medications  Medication Sig Start Date End Date Taking? Authorizing Provider  amoxicillin -clavulanate (AUGMENTIN ) 875-125 MG tablet Take 1 tablet by mouth every 12 (twelve) hours. 05/26/24   Jodi Jamee Jacob, MD  azithromycin  (ZITHROMAX  Z-PAK) 250 MG tablet Take two pills today followed by one a day until gone 05/26/24   Jodi Jamee Jacob, MD  chlorpheniramine-HYDROcodone  (TUSSIONEX) 10-8 MG/5ML Take 5 mLs by mouth every 12 (twelve) hours as needed for cough. 05/26/24   Jodi Jamee Jacob, MD  Cholecalciferol (VITAMIN D3) 1.25 MG (50000 UT) TABS Take by mouth.    [provider]  diphenhydrAMINE (BENADRYL) 25 MG tablet Take 25 mg by mouth every 6 (six) hours as needed.    [provider]  Multiple Vitamin (MULTIVITAMIN) tablet Take 1 tablet by mouth daily.    [provider]  Omega-3 Fatty Acids (FISH OIL)  1000 MG CAPS Take by mouth.    [provider]  pyridOXINE (B-6) 50 MG tablet Take 50 mg by mouth daily.    [provider]  Turmeric (QC TUMERIC COMPLEX) 500 MG CAPS Take by mouth.    [provider]  valACYclovir  (VALTREX ) 500 MG tablet Take 1 tablet by mouth twice a day for  3 days. 12/11/22   Jodi Coy, MD  vitamin E 200 UNIT capsule Take 200 Units by mouth daily.    [provider]  Zinc 100 MG TABS Take by mouth.    [provider]    Physical Exam: Vitals:   05/28/24 2000 05/28/24 2100 05/28/24 2212 05/28/24 2300  BP: (!) 141/94   (!) 148/95  Pulse: (!) 118 (!) 110  (!) 109  Resp: (!) 21 (!) 21  (!) 22  Temp:   98.4 F (36.9 C)   TempSrc:   Oral   SpO2: 93% 96%  95%  Weight:      Height:        Constitutional: NAD, calm, comfortable Vitals:   05/28/24 2000 05/28/24 2100 05/28/24 2212 05/28/24 2300  BP: (!) 141/94   (!) 148/95  Pulse: (!) 118 (!) 110  (!) 109  Resp: (!) 21 (!) 21  (!) 22  Temp:   98.4 F (36.9 C)   TempSrc:   Oral   SpO2: 93% 96%  95%  Weight:      Height:       Eyes: PERRL, lids and conjunctivae normal ENMT: Mucous membranes are moist. Posterior pharynx clear of any exudate or lesions.Normal dentition.  Neck: normal, supple, no masses, no thyromegaly Respiratory: clear to auscultation bilaterally, no wheezing, no crackles. Normal respiratory effort. No accessory muscle use.  Cardiovascular: Regular rate and rhythm, no murmurs / rubs / gallops. No extremity edema. 2+ pedal pulses. No carotid bruits.  Abdomen: no tenderness, no masses palpated. No hepatosplenomegaly. Bowel sounds positive.  Musculoskeletal: no clubbing / cyanosis. No joint deformity upper and lower extremities. Good ROM, no contractures. Normal muscle tone.  Skin: no rashes, lesions, ulcers. No induration Neurologic: CN 2-12 grossly intact. Sensation intact, DTR normal. Strength 5/5 in all 4.  Psychiatric: Normal judgment and insight. Alert and oriented x 3. Normal mood.    Labs on Admission: I have personally reviewed following labs and imaging studies  CBC: Recent Labs  Lab 05/28/24 1629  WBC 10.5  NEUTROABS 6.4  HGB 12.6  HCT 39.2  MCV 83.2  PLT 598*   Basic Metabolic Panel: Recent Labs  Lab 05/28/24 1629   NA 132*  K 4.7  CL 96*  CO2 26  GLUCOSE 110*  BUN 18  CREATININE 0.61  CALCIUM 9.3   GFR: Estimated Creatinine Clearance: 64.8 mL/min (by C-G formula based on SCr of 0.61 mg/dL). Liver Function Tests: Recent Labs  Lab 05/28/24 1629  AST 32  ALT 14  ALKPHOS 157*  BILITOT 0.2  PROT 8.8*  ALBUMIN 3.1*   No results for input(s): LIPASE, AMYLASE in the last 168 hours. No results for input(s): AMMONIA in the last 168 hours. Coagulation Profile: Recent Labs  Lab 05/28/24 1629  INR 1.1   Cardiac Enzymes: No results for input(s): CKTOTAL, CKMB, CKMBINDEX, TROPONINI in the last 168 hours. BNP (last 3 results) No results for input(s): PROBNP in the last 8760 hours. HbA1C: No results for input(s): HGBA1C in the last 72 hours. CBG: No results for input(s): GLUCAP in the last 168 hours. Lipid Profile: No  results for input(s): CHOL, HDL, LDLCALC, TRIG, CHOLHDL, LDLDIRECT in the last 72 hours. Thyroid Function Tests: No results for input(s): TSH, T4TOTAL, FREET4, T3FREE, THYROIDAB in the last 72 hours. Anemia Panel: No results for input(s): VITAMINB12, FOLATE, FERRITIN, TIBC, IRON, RETICCTPCT in the last 72 hours. Urine analysis: No results found for: COLORURINE, APPEARANCEUR, LABSPEC, PHURINE, GLUCOSEU, HGBUR, BILIRUBINUR, KETONESUR, PROTEINUR, UROBILINOGEN, NITRITE, LEUKOCYTESUR  Radiological Exams on Admission: CT Chest Wo Contrast Result Date: 05/28/2024 EXAM: CT CHEST WITHOUT CONTRAST 05/28/2024 06:23:00 PM TECHNIQUE: CT of the chest was performed without the administration of intravenous contrast. Multiplanar reformatted images are provided for review. Automated exposure control, iterative reconstruction, and/or weight based adjustment of the mA/kV was utilized to reduce the radiation dose to as low as reasonably achievable. COMPARISON: 10/26/2024, 05/26/2024 CLINICAL HISTORY: Pleural effusion,  malignancy suspected. FINDINGS: MEDIASTINUM: Heart and pericardium are unremarkable. The central airways are clear. Significant mass effect and rightward shift of the cardiomediastinal structures due to the pleural effusion. Enlarged left lower neck lymph nodes with stippled calcification throughout, the largest measuring 3.5 cm in transverse dimension (axial 14). These lymph nodes cause significant mass effect, rightward shift, and severe narrowing of the trachea of the thoracic inlet. LYMPH NODES: Multiple enlarged mediastinal lymph nodes. For example, there is a prevascular lymph node measuring 1.5 cm (axial 34), containing stippled calcification. Multiple enlarged AP window lymph nodes measuring up to 2 cm (axial 57). Clustered enlarged retrocrural lymph nodes measuring up to 1.7 cm on the right (axial 117). Cardiophrenic lymph nodes are also prominent measuring up to 8 mm (axial 95). Enlarged superior left retroperitoneal lymph node measuring up to 1.6 cm (axial 145). LUNGS AND PLEURA: Redemonstrated large right pleural effusion with complete collapse of the right middle and right lower lobes. Near complete collapse of the right upper lobe is also present. No pneumothorax. SOFT TISSUES/BONES: Multilevel thoracic osteophytosis. Peripherally calcified bilateral breast implants. Findings suggestive of intracapsular rupture noted. UPPER ABDOMEN: Limited images of the upper abdomen demonstrates no acute abnormality. Enlarged superior left retroperitoneal lymph node measuring up to 1.6 cm (axial 145). IMPRESSION: 1. Large right pleural effusion with complete collapse of the right middle and right lower lobes, near complete collapse of the right upper lobe, and significant mass effect and rightward shift of the cardiomediastinal structures. Underlying pulmonary neoplasm or hilar neoplasm is difficult to exclude given the lack of intravenous contrast. 2. Enlarged left lower neck lymph nodes with stippled calcification  causing significant mass effect, rightward shift, and severe narrowing of the trachea at the thoracic inlet. This is consistent with metastatic disease. Clinical correlation for patient's respiratory status requested. 3. Extensive lymphadenopathy involving the mediastinum, retrocrural region, and superior left retroperitoneum, some with stippled calcification, consistent with either metastatic lymphadenopathy or underlying lymphoma. Electronically signed by: Rogelia Myers MD 05/28/2024 06:54 PM EST RP Workstation: [id number]   DG Chest Port 1 View Result Date: 05/28/2024 CLINICAL DATA:  Shortness of breath and cough EXAM: PORTABLE CHEST 1 VIEW COMPARISON:  05/28/2024 FINDINGS: Large right-sided pleural effusion, increased compared to prior. Underlying airspace disease. Stable cardiomediastinal silhouette with aortic atherosclerosis. No pneumothorax. Bilateral breast implants. IMPRESSION: Large right-sided pleural effusion, increased compared to prior. Underlying pneumonia or mass not excluded. Electronically Signed   By: Luke Bun M.D.   On: 05/28/2024 17:56    EKG: Independently reviewed. ***  Assessment/Plan Large Right sided pleural effusion  -pulmonary consult in am  - supportive care with O2 as needed   DVT prophylaxis: *** (Lovenox/Heparin /SCD's/anticoagulated/None (if comfort care)  Code Status: *** (Full/Partial (specify details) Family Communication: *** (Specify name, relationship. Do not write discussed with patient. Specify tel # if discussed over the phone) Disposition Plan: *** (specify when and where you expect patient to be discharged) Consults called: *** (with names) Admission status: *** (inpatient / obs / tele / medical floor / SDU)   Camila DELENA Ned MD [facility] Pager [phone]  If 7PM-7AM, please contact night-coverage [email or web address] Password [name]  05/28/2024, 11:51 PM         [1] Allergies Allergen Reactions   Codeine Itching  "

## 2024-05-28 NOTE — ED Triage Notes (Signed)
 Pt c/o shortness of breath, productive cough x 1 week, ongoing illness x 2 months.   Denies fever. RT in triage to assess. Currently taking antibiotics since Wednesday.

## 2024-05-29 ENCOUNTER — Inpatient Hospital Stay (HOSPITAL_COMMUNITY)

## 2024-05-29 ENCOUNTER — Encounter (HOSPITAL_COMMUNITY): Payer: Self-pay | Admitting: Family Medicine

## 2024-05-29 DIAGNOSIS — Z720 Tobacco use: Secondary | ICD-10-CM | POA: Diagnosis not present

## 2024-05-29 DIAGNOSIS — J90 Pleural effusion, not elsewhere classified: Secondary | ICD-10-CM | POA: Diagnosis not present

## 2024-05-29 LAB — COMPREHENSIVE METABOLIC PANEL WITH GFR
ALT: 11 U/L (ref 0–44)
AST: 27 U/L (ref 15–41)
Albumin: 2.6 g/dL — ABNORMAL LOW (ref 3.5–5.0)
Alkaline Phosphatase: 133 U/L — ABNORMAL HIGH (ref 38–126)
Anion gap: 8 (ref 5–15)
BUN: 15 mg/dL (ref 8–23)
CO2: 27 mmol/L (ref 22–32)
Calcium: 8.8 mg/dL — ABNORMAL LOW (ref 8.9–10.3)
Chloride: 98 mmol/L (ref 98–111)
Creatinine, Ser: 0.61 mg/dL (ref 0.44–1.00)
GFR, Estimated: >60 mL/min
Glucose, Bld: 135 mg/dL — ABNORMAL HIGH (ref 70–99)
Potassium: 4.1 mmol/L (ref 3.5–5.1)
Sodium: 133 mmol/L — ABNORMAL LOW (ref 135–145)
Total Bilirubin: 0.2 mg/dL (ref 0.0–1.2)
Total Protein: 7.4 g/dL (ref 6.5–8.1)

## 2024-05-29 LAB — RETICULOCYTES
Immature Retic Fract: 5.2 % (ref 2.3–15.9)
RBC.: 4.24 MIL/uL (ref 3.87–5.11)
Retic Count, Absolute: 28.8 K/uL (ref 19.0–186.0)
Retic Ct Pct: 0.7 % (ref 0.4–3.1)

## 2024-05-29 LAB — CBC
HCT: 34 % — ABNORMAL LOW (ref 36.0–46.0)
HCT: 36.8 % (ref 36.0–46.0)
Hemoglobin: 10.9 g/dL — ABNORMAL LOW (ref 12.0–15.0)
Hemoglobin: 11.8 g/dL — ABNORMAL LOW (ref 12.0–15.0)
MCH: 26.8 pg (ref 26.0–34.0)
MCH: 27 pg (ref 26.0–34.0)
MCHC: 32.1 g/dL (ref 30.0–36.0)
MCHC: 32.1 g/dL (ref 30.0–36.0)
MCV: 83.5 fL (ref 80.0–100.0)
MCV: 84.2 fL (ref 80.0–100.0)
Platelets: 531 K/uL — ABNORMAL HIGH (ref 150–400)
Platelets: 557 K/uL — ABNORMAL HIGH (ref 150–400)
RBC: 4.07 MIL/uL (ref 3.87–5.11)
RBC: 4.37 MIL/uL (ref 3.87–5.11)
RDW: 13.5 % (ref 11.5–15.5)
RDW: 13.5 % (ref 11.5–15.5)
WBC: 10.2 K/uL (ref 4.0–10.5)
WBC: 10.4 K/uL (ref 4.0–10.5)
nRBC: 0 % (ref 0.0–0.2)
nRBC: 0 % (ref 0.0–0.2)

## 2024-05-29 LAB — IRON AND TIBC
Iron: 17 ug/dL — ABNORMAL LOW (ref 28–170)
Saturation Ratios: 9 % — ABNORMAL LOW (ref 10.4–31.8)
TIBC: 190 ug/dL — ABNORMAL LOW (ref 250–450)
UIBC: 173 ug/dL

## 2024-05-29 LAB — FERRITIN: Ferritin: 747 ng/mL — ABNORMAL HIGH (ref 11–307)

## 2024-05-29 LAB — CREATININE, SERUM
Creatinine, Ser: 0.56 mg/dL (ref 0.44–1.00)
GFR, Estimated: >60 mL/min

## 2024-05-29 LAB — BODY FLUID CELL COUNT WITH DIFFERENTIAL
Eos, Fluid: 2 %
Lymphs, Fluid: 62 %
Monocyte-Macrophage-Serous Fluid: 24 % — ABNORMAL LOW (ref 50–90)
Neutrophil Count, Fluid: 12 % (ref 0–25)
Total Nucleated Cell Count, Fluid: 4863 uL — ABNORMAL HIGH (ref 0–1000)

## 2024-05-29 LAB — LACTIC ACID, PLASMA: Lactic Acid, Venous: 0.9 mmol/L (ref 0.5–1.9)

## 2024-05-29 LAB — SODIUM, URINE, RANDOM: Sodium, Ur: <30 mmol/L

## 2024-05-29 LAB — OSMOLALITY, URINE: Osmolality, Ur: 250 mosm/kg — ABNORMAL LOW (ref 300–900)

## 2024-05-29 LAB — OSMOLALITY: Osmolality: 285 mosm/kg (ref 275–295)

## 2024-05-29 LAB — FOLATE: Folate: 11 ng/mL

## 2024-05-29 LAB — HIV ANTIBODY (ROUTINE TESTING W REFLEX): HIV Screen 4th Generation wRfx: NONREACTIVE

## 2024-05-29 MED ORDER — LOPERAMIDE HCL 2 MG PO CAPS: 2.0000 mg | ORAL_CAPSULE | ORAL | Status: DC | PRN

## 2024-05-29 MED ORDER — IOHEXOL 300 MG/ML  SOLN
100.0000 mL | Freq: Once | INTRAMUSCULAR | Status: AC | PRN
  Administered 2024-05-29: 100 mL via INTRAVENOUS

## 2024-05-29 MED ORDER — FLUTICASONE PROPIONATE 50 MCG/ACT NA SUSP
2.0000 | Freq: Every day | NASAL | Status: DC
  Administered 2024-05-29 – 2024-06-03 (×6): 2 via NASAL
  Filled 2024-05-29: qty 16

## 2024-05-29 MED ORDER — SODIUM CHLORIDE 0.9% FLUSH
10.0000 mL | Freq: Three times a day (TID) | INTRAVENOUS | Status: DC
  Administered 2024-05-29 – 2024-06-02 (×12): 10 mL via INTRAPLEURAL

## 2024-05-29 MED ORDER — HYDROCODONE-ACETAMINOPHEN 5-325 MG PO TABS
1.0000 | ORAL_TABLET | ORAL | Status: DC | PRN
  Administered 2024-05-29 – 2024-05-31 (×6): 2 via ORAL
  Administered 2024-05-31: 1 via ORAL
  Administered 2024-05-31: 2 via ORAL
  Administered 2024-05-31: 1 via ORAL
  Administered 2024-05-31 – 2024-06-03 (×10): 2 via ORAL
  Filled 2024-05-29 (×11): qty 2
  Filled 2024-05-29: qty 1
  Filled 2024-05-29: qty 2
  Filled 2024-05-29: qty 1
  Filled 2024-05-29 (×5): qty 2

## 2024-05-29 MED ORDER — ZOLPIDEM TARTRATE 5 MG PO TABS
5.0000 mg | ORAL_TABLET | Freq: Every day | ORAL | Status: DC
  Administered 2024-05-29 – 2024-06-01 (×4): 5 mg via ORAL
  Filled 2024-05-29 (×4): qty 1

## 2024-05-29 NOTE — Consult Note (Addendum)
 "  Chief Complaint: Dyspnea, cough, large right pleural effusion, lymphadenopathy; referred for image guided left neck lymph node biopsy  Referring Provider(s): Jodi Cameron,S  Supervising Physician: Jodi Cameron,D  Patient Status: [place] - In-pt  History of Present Illness: Jodi Cameron is a 69 y.o. female ex-smoker with past medical history significant for recent pneumonia with outpatient antibiotic therapy who presents now with persistent dyspnea, tachycardia, cough and CT findings including large right pleural effusion with complete collapse of the right middle and right lower lobes, near complete collapse of the right upper lobe and significant mass effect and rightward shift of the cardiomediastinal structures.  Patient also with enlarged left lower neck lymph nodes, additional extensive lymphadenopathy involving the mediastinum, retrocrural region and superior left retroperitoneum.  Patient has no prior history of malignancy.  She is status post right chest tube placement by CCM earlier today.  Pleural fluid cytology pending.  Request now received from [name] for image guided neck  lymph node biopsy.  CT abdomen pelvis pending.   Patient is Full Code  History reviewed. No pertinent past medical history.  Past Surgical History:  Procedure Laterality Date   AUGMENTATION MAMMAPLASTY Bilateral    Subglandular, pt's second set of implants   BREAST ENHANCEMENT SURGERY     let rotator cuff surgery  08/2022   PARTIAL HYSTERECTOMY      Allergies: Codeine  Medications: Prior to Admission medications  Medication Sig Start Date End Date Taking? Authorizing Provider  amoxicillin -clavulanate (AUGMENTIN ) 875-125 MG tablet Take 1 tablet by mouth every 12 (twelve) hours. 05/26/24   Jodi Jamee Jacob, MD  azithromycin  (ZITHROMAX  Z-PAK) 250 MG tablet Take two pills today followed by one a day until gone 05/26/24   Jodi Jamee Jacob, MD  chlorpheniramine-HYDROcodone  (TUSSIONEX) 10-8 MG/5ML Take 5 mLs by mouth  every 12 (twelve) hours as needed for cough. 05/26/24   Jodi Jamee Jacob, MD  Cholecalciferol (VITAMIN D3) 1.25 MG (50000 UT) TABS Take by mouth.    [provider]  diphenhydrAMINE (BENADRYL) 25 MG tablet Take 25 mg by mouth every 6 (six) hours as needed.    [provider]  Multiple Vitamin (MULTIVITAMIN) tablet Take 1 tablet by mouth daily.    [provider]  Omega-3 Fatty Acids (FISH OIL) 1000 MG CAPS Take by mouth.    [provider]  pyridOXINE (B-6) 50 MG tablet Take 50 mg by mouth daily.    [provider]  Turmeric (QC TUMERIC COMPLEX) 500 MG CAPS Take by mouth.    [provider]  valACYclovir  (VALTREX ) 500 MG tablet Take 1 tablet by mouth twice a day for 3 days. 12/11/22   Jodi Coy, MD  vitamin E 200 UNIT capsule Take 200 Units by mouth daily.    [provider]  Zinc 100 MG TABS Take by mouth.    [provider]     Family History  Problem Relation Age of Onset   Alzheimer's disease Mother    Colon cancer Maternal Grandmother    Breast cancer Half-Sister     Social History   Socioeconomic History   Marital status: Single    Spouse name: Not on file   Number of children: Not on file   Years of education: Not on file   Highest education level: Not on file  Occupational History   Not on file  Tobacco Use   Smoking status: Former    Current packs/day: 0.00    Types: Cigarettes    Quit date: 1  Years since quitting: 40.0   Smokeless tobacco: Never   Tobacco comments:    1 pack a week  Substance and Sexual Activity   Alcohol use: Yes    Comment: 1-2 times per month   Drug use: Not Currently   Sexual activity: Not Currently  Other Topics Concern   Not on file  Social History Narrative   Not on file   Social Drivers of Health   Tobacco Use: Medium Risk (05/29/2024)   Patient History    Smoking Tobacco Use: Former    Smokeless Tobacco Use: Never    Passive Exposure: Not  on Actuary Strain: Not on file  Food Insecurity: No Food Insecurity (05/28/2024)   Epic    Worried About Programme Researcher, Broadcasting/film/video in the Last Year: Never true    Ran Out of Food in the Last Year: Never true  Transportation Needs: No Transportation Needs (05/28/2024)   Epic    Lack of Transportation (Medical): No    Lack of Transportation (Non-Medical): No  Physical Activity: Not on file  Stress: Not on file  Social Connections: Socially Isolated (05/28/2024)   Social Connection and Isolation Panel    Frequency of Communication with Friends and Family: More than three times a week    Frequency of Social Gatherings with Friends and Family: Once a week    Attends Religious Services: Never    Database Administrator or Organizations: No    Attends Banker Meetings: Never    Marital Status: Divorced  Depression (PHQ2-9): High Risk (05/28/2024)   Depression (PHQ2-9)    PHQ-2 Score: 15  Alcohol Screen: Not on file  Housing: Low Risk (05/28/2024)   Epic    Unable to Pay for Housing in the Last Year: No    Number of Times Moved in the Last Year: 0    Homeless in the Last Year: No  Utilities: Not At Risk (05/28/2024)   Epic    Threatened with loss of utilities: No  Health Literacy: Not on file       Review of Systems currently denies fever, headache, abdominal pain, nausea, vomiting or bleeding.  She does have soreness at right chest tube site, occasional cough, some dyspnea with exertion, back pain.  Vital Signs: BP 120/87   Pulse (!) 110   Temp 97.8 F (36.6 C)   Resp 14   Ht 5' 6 (1.676 m)   Wt 140 lb (63.5 kg)   SpO2 95%   BMI 22.60 kg/m   Advance Care Plan: No documents on file   Physical Exam: Awake, alert.  Thin white female ; chest with diminished breath sounds on right, clear left.  Right chest tube intact draining blood-tinged fluid; heart with slightly tachycardic but regular rhythm.  Abdomen soft, positive bowel sounds, nontender.  No lower  extremity edema.  Palpable left neck lymphadenopathy, slightly tender to palpation.  Imaging: DG CHEST PORT 1 VIEW Result Date: 05/29/2024 EXAM: 1 VIEW(S) XRAY OF THE CHEST 05/29/2024 11:47:00 AM COMPARISON: 05/28/2024 CLINICAL HISTORY: Encounter for chest tube placement FINDINGS: LINES, TUBES AND DEVICES: Right chest tube with pigtail overlying the medial right lower hemithorax. LUNGS AND PLEURA: Large right pleural effusion, decreased compared to prior exam. No focal pulmonary opacity. No pneumothorax. HEART AND MEDIASTINUM: No acute abnormality of the cardiac and mediastinal silhouettes. BONES AND SOFT TISSUES: Cystic changes in the right glenoid, likely degenerative. IMPRESSION: 1. Right chest tube with pigtail overlying the medial right lower hemithorax.  2. Large right pleural effusion, decreased. Electronically signed by: Waddell Calk MD 05/29/2024 12:00 PM EST RP Workstation: [id number]   CT Chest Wo Contrast Result Date: 05/28/2024 EXAM: CT CHEST WITHOUT CONTRAST 05/28/2024 06:23:00 PM TECHNIQUE: CT of the chest was performed without the administration of intravenous contrast. Multiplanar reformatted images are provided for review. Automated exposure control, iterative reconstruction, and/or weight based adjustment of the mA/kV was utilized to reduce the radiation dose to as low as reasonably achievable. COMPARISON: 10/26/2024, 05/26/2024 CLINICAL HISTORY: Pleural effusion, malignancy suspected. FINDINGS: MEDIASTINUM: Heart and pericardium are unremarkable. The central airways are clear. Significant mass effect and rightward shift of the cardiomediastinal structures due to the pleural effusion. Enlarged left lower neck lymph nodes with stippled calcification throughout, the largest measuring 3.5 cm in transverse dimension (axial 14). These lymph nodes cause significant mass effect, rightward shift, and severe narrowing of the trachea of the thoracic inlet. LYMPH NODES: Multiple enlarged mediastinal  lymph nodes. For example, there is a prevascular lymph node measuring 1.5 cm (axial 34), containing stippled calcification. Multiple enlarged AP window lymph nodes measuring up to 2 cm (axial 57). Clustered enlarged retrocrural lymph nodes measuring up to 1.7 cm on the right (axial 117). Cardiophrenic lymph nodes are also prominent measuring up to 8 mm (axial 95). Enlarged superior left retroperitoneal lymph node measuring up to 1.6 cm (axial 145). LUNGS AND PLEURA: Redemonstrated large right pleural effusion with complete collapse of the right middle and right lower lobes. Near complete collapse of the right upper lobe is also present. No pneumothorax. SOFT TISSUES/BONES: Multilevel thoracic osteophytosis. Peripherally calcified bilateral breast implants. Findings suggestive of intracapsular rupture noted. UPPER ABDOMEN: Limited images of the upper abdomen demonstrates no acute abnormality. Enlarged superior left retroperitoneal lymph node measuring up to 1.6 cm (axial 145). IMPRESSION: 1. Large right pleural effusion with complete collapse of the right middle and right lower lobes, near complete collapse of the right upper lobe, and significant mass effect and rightward shift of the cardiomediastinal structures. Underlying pulmonary neoplasm or hilar neoplasm is difficult to exclude given the lack of intravenous contrast. 2. Enlarged left lower neck lymph nodes with stippled calcification causing significant mass effect, rightward shift, and severe narrowing of the trachea at the thoracic inlet. This is consistent with metastatic disease. Clinical correlation for patient's respiratory status requested. 3. Extensive lymphadenopathy involving the mediastinum, retrocrural region, and superior left retroperitoneum, some with stippled calcification, consistent with either metastatic lymphadenopathy or underlying lymphoma. Electronically signed by: Rogelia Myers MD 05/28/2024 06:54 PM EST RP Workstation: [id number]    DG Chest Port 1 View Result Date: 05/28/2024 CLINICAL DATA:  Shortness of breath and cough EXAM: PORTABLE CHEST 1 VIEW COMPARISON:  05/28/2024 FINDINGS: Large right-sided pleural effusion, increased compared to prior. Underlying airspace disease. Stable cardiomediastinal silhouette with aortic atherosclerosis. No pneumothorax. Bilateral breast implants. IMPRESSION: Large right-sided pleural effusion, increased compared to prior. Underlying pneumonia or mass not excluded. Electronically Signed   By: Luke Bun M.D.   On: 05/28/2024 17:56   DG Chest 2 View Result Date: 05/26/2024 CLINICAL DATA:  Cough for 4 weeks, right chest pain. EXAM: CHEST - 2 VIEW COMPARISON:  None Available. FINDINGS: Trachea is midline. Heart is at the upper limits of normal in size. Large right pleural effusion with collapse/consolidation in the right lung base. Left lung is clear. Degenerative changes in the spine. IMPRESSION: Large right pleural effusion with collapse/consolidation in the right lung base. Findings may be due to pneumonia with a parapneumonic effusion. Empyema cannot  be excluded, nor can underlying malignancy. Followup PA and lateral chest X-ray is recommended in 3-4 weeks following trial of antibiotic therapy to ensure resolution and exclude underlying malignancy. Electronically Signed   By: Newell Eke M.D.   On: 05/26/2024 10:38    Labs:  CBC: Recent Labs    05/28/24 1629 05/29/24 0026 05/29/24 0340  WBC 10.5 10.4 10.2  HGB 12.6 11.8* 10.9*  HCT 39.2 36.8 34.0*  PLT 598* 557* 531*    COAGS: Recent Labs    05/28/24 1629  INR 1.1    BMP: Recent Labs    05/28/24 1629 05/29/24 0026 05/29/24 0340  NA 132*  --  133*  K 4.7  --  4.1  CL 96*  --  98  CO2 26  --  27  GLUCOSE 110*  --  135*  BUN 18  --  15  CALCIUM 9.3  --  8.8*  CREATININE 0.61 0.56 0.61  GFRNONAA >60 >60 >60    LIVER FUNCTION TESTS: Recent Labs    05/28/24 1629 05/29/24 0340  BILITOT 0.2 0.2  AST 32 27   ALT 14 11  ALKPHOS 157* 133*  PROT 8.8* 7.4  ALBUMIN 3.1* 2.6*    TUMOR MARKERS: No results for input(s): AFPTM, CEA, CA199, CHROMGRNA in the last 8760 hours.  Assessment and Plan: 70 y.o. female ex-smoker with past medical history significant for recent pneumonia with outpatient antibiotic therapy who presents now with persistent dyspnea, tachycardia, cough and CT findings including large right pleural effusion with complete collapse of the right middle and right lower lobes, near complete collapse of the right upper lobe and significant mass effect and rightward shift of the cardiomediastinal structures.  Patient also with enlarged left lower neck lymph nodes, additional extensive lymphadenopathy involving the mediastinum, retrocrural region and superior left retroperitoneum.  Patient has no prior history of malignancy.  She is status post right chest tube placement by CCM earlier today.  Pleural fluid cytology pending.  Request now received from [name] for image guided left neck  lymph node biopsy.  CT abdomen pelvis pending.  Latest imaging studies have been reviewed by Dr. Jennefer and pt approved for left neck lymph node biopsy on 1/19. Risks and benefits of procedure was discussed with the patient including, but not limited to bleeding, infection, damage to adjacent structures or low yield requiring additional tests.  All of the questions were answered and there is agreement to proceed.  Consent signed and in chart.  Patient prefers IV conscious sedation for procedure; hold hep injection starting on 1/18 pm  Thank you for allowing our service to participate in Jodi Cameron 's care.  Electronically Signed: D. Franky Rakers, PA-C   05/29/2024, 2:23 PM      I spent a total of  30 minutes   in face to face in clinical consultation, greater than 50% of which was counseling/coordinating care for image guided left neck lymph node biopsy   "

## 2024-05-29 NOTE — Progress Notes (Signed)
 " [facility] Progress Note  Patient: Jodi Cameron     FMW:[id number]  DOA: 05/28/2024   PCP: Wheeler Harlene CROME, NP       Brief hospital course: 69 y/o F with tobacco abuse presented with dyspnea. Shew as recently given Augmentin  and Z pak but symptoms did not improve.   In the ED CT shows:   Large R pleural effusion with complete collapse of R lung, left lower neck lymphadenopathy with mass affect on trachea and lymphadenopathy in mediastinum, retrocrural area and superior left retroperitoneal area.   Noted to have dyspnea and tachypnea but no hyoxia.   Subjective:  Seen after thoracentesis. Feels much better. Has a cough  Assessment and Plan: Principal Problem:   Pleural effusion on right  - 1/5 L removed- tube clamped due to coughing- Pulm plans to come back to drain remaining fluid - bloody fluid in chest tube  - total nucleated cells 4,863- 62% lymphocytes - HIV neg - cont rocephin  and azithromycin  - hydrocodone  ordered for pain from chest tube - will obtain CT abd/pelvis as well  Lymphadenopathy, cervical, mediastinal, retroperitoneal and retrocrural - IR consult for biopsy  Nicotine abuse - counseling done  Hyponatremia - follow- may be SIADH- check U sod, osm and s osm  Hypoalbuminemia - Albumin 2.6 today  Elevated alk phos - follow- may be related to lymphadenopathy  Anemia - Hgb 12.6> 10.9 - check anemia panel  Thrombocytosis - follow      Code Status: Full Code Total time on patient care: 35 min DVT prophylaxis:  heparin  injection 5,000 Units Start: 05/29/24 0600     Objective:   Vitals:   05/29/24 0758 05/29/24 1042 05/29/24 1109 05/29/24 1127  BP: 138/87 139/83 120/77 120/87  Pulse: 100   (!) 110  Resp: 13 (!) 21 19 14   Temp: 98.3 F (36.8 C)   97.8 F (36.6 C)  TempSrc:      SpO2: 97% 94% 95%   Weight:      Height:       Filed Weights   05/28/24 1626  Weight: 63.5 kg   Exam: General exam: Appears comfortable   HEENT: oral mucosa moist Respiratory system: poor breath sounds in right lung field, cough Cardiovascular system: S1 & S2 heard  Gastrointestinal system: Abdomen soft, non-tender, nondistended. Normal bowel sounds   Extremities: No cyanosis, clubbing or edema Psychiatry:  Mood & affect appropriate.      CBC: Recent Labs  Lab 05/28/24 1629 05/29/24 0026 05/29/24 0340  WBC 10.5 10.4 10.2  NEUTROABS 6.4  --   --   HGB 12.6 11.8* 10.9*  HCT 39.2 36.8 34.0*  MCV 83.2 84.2 83.5  PLT 598* 557* 531*   Basic Metabolic Panel: Recent Labs  Lab 05/28/24 1629 05/29/24 0026 05/29/24 0340  NA 132*  --  133*  K 4.7  --  4.1  CL 96*  --  98  CO2 26  --  27  GLUCOSE 110*  --  135*  BUN 18  --  15  CREATININE 0.61 0.56 0.61  CALCIUM 9.3  --  8.8*     Scheduled Meds:  azithromycin   500 mg Oral Daily   fluticasone   2 spray Each Nare Daily   heparin   5,000 Units Subcutaneous Q8H   sodium chloride  flush  10 mL Intrapleural Q8H   zolpidem   5 mg Oral QHS    Imaging and lab data personally reviewed   Author: Amato Sevillano  05/29/2024 12:50 PM  To contact [facility]>   Check the care team in [facility] and look for the attending/consulting [name] provider listed  Log into [email or web address] and use [place]'s universal password   Go to> [facility]  and find provider  If you still have difficulty reaching the provider, please page the [facility] (Director on Call) for the Hospitalists listed on amion     "

## 2024-05-29 NOTE — Consult Note (Addendum)
 "  NAME:  Jodi Cameron, MRN:  [id number], DOB:  [birth year], LOS: 1 ADMISSION DATE:  05/28/2024, CONSULTATION DATE:  05/29/2024  History of Present Illness:  Jodi Cameron is a 69 yo with significant PMHX of smoking 0.5-1ppd and she has quitted 3 weeks ago.  She started having 4 months ago some GI issues, and since 4 days ago, she start experiencing severe shortness of breath with exertion. She went to the PCP and she noticed tachypneic, tachycardic and she was sent to the ER for evaluation. She had some weight loss related to her GI issues, but her weight is back to normal.  She has not f/u with pcp over the last 1.5 years. She has not had hx of COPD or following with pulmonary.  CT scan showed a large pleural effusion with complete collapse of the right lung, with rightward shift. Enlarged LL neck LN  with rightward shift, and severe narrowing of the trachea, concerning for metastatic disease. Extensive LAD involving mediastinum, retrocural region RP. Pulmonary was called for drainage of pleural fluid and possible biopsies.  Pertinent  Medical History  Tobacco use   Significant Hospital Events: Including procedures, antibiotic start and stop dates in addition to other pertinent events   05/29/24 - chest tube placement.  Interim History / Subjective:  Patient feels short of breath walking around the room and talking.  She need pause multiple times to finish her sentences. Mild resp distress  Objective    Blood pressure 120/87, pulse (!) 110, temperature 97.8 F (36.6 C), resp. rate 14, height 5' 6 (1.676 m), weight 63.5 kg, SpO2 95%.        Intake/Output Summary (Last 24 hours) at 05/29/2024 1147 Last data filed at 05/29/2024 1115 Gross per 24 hour  Intake 1336.44 ml  Output 1530 ml  Net -193.56 ml   Filed Weights   05/28/24 1626  Weight: 63.5 kg    Examination: Physical Exam Constitutional:      Comments: BMI 22, thin  HENT:     Head: Normocephalic.  Eyes:     Extraocular  Movements: Extraocular movements intact.     Pupils: Pupils are equal, round, and reactive to light.  Cardiovascular:     Rate and Rhythm: Normal rate and regular rhythm.  Pulmonary:     Comments: Mild respiratory distress, no lung sounds on the right side, and normal lung sounds on the left lung. No wheezing. Abdominal:     Palpations: Abdomen is soft.  Musculoskeletal:        General: Normal range of motion.     Cervical back: Normal range of motion.  Neurological:     General: No focal deficit present.     Mental Status: She is alert and oriented to person, place, and time.    CT WO CON 05/29/2023 - large R pl;eural effusion with complete collapse of the R lung and significant mass effect and rightward shift of the cardiomediastinal structures. Enlarged Left lower neck LN with mass effect rightward shift and severe narrowing of the trachea. Concerning with metastatic disease.     Resolved problem list   Assessment and Plan   Large R pleural effusion with complete collapse of the R lung s/p chest tube Large lower neck LN with mass effect with tracheal narrowing Smoker 0.5-1ppd (quit 3 weeks ago)  Patient presented with large pleural effusion and LAD concerning for malignancy. Chest tube was placed and drain initially 1.5L and then she experience chest pain and cough so it was  clamped for a few hours.   Chemistry, cultures and cytology was sent. Given a severe neck lymphadenopathy I will recommend to consult IR for guided biopsy. She will need a PET scan after biopsy results.   Plan: -Continue drainage chest tube water seal - no suctioning for today.  -NS flushing 10 cc to the atrium and 20 cc to patient q 8 hours. -Currently chest tube is clamped I will reopen during the afternoon.  -pending chemistry, cx, cytology. -Daily CXR -IR for LN neck biopsy -pulmonary will continue to follow up for chest tube management.  Labs   CBC: Recent Labs  Lab 05/28/24 1629 05/29/24 0026  05/29/24 0340  WBC 10.5 10.4 10.2  NEUTROABS 6.4  --   --   HGB 12.6 11.8* 10.9*  HCT 39.2 36.8 34.0*  MCV 83.2 84.2 83.5  PLT 598* 557* 531*    Basic Metabolic Panel: Recent Labs  Lab 05/28/24 1629 05/29/24 0026 05/29/24 0340  NA 132*  --  133*  K 4.7  --  4.1  CL 96*  --  98  CO2 26  --  27  GLUCOSE 110*  --  135*  BUN 18  --  15  CREATININE 0.61 0.56 0.61  CALCIUM 9.3  --  8.8*   GFR: Estimated Creatinine Clearance: 64.8 mL/min (by C-G formula based on SCr of 0.61 mg/dL). Recent Labs  Lab 05/28/24 1629 05/29/24 0026 05/29/24 0340  WBC 10.5 10.4 10.2  LATICACIDVEN 1.3 0.9  --     Liver Function Tests: Recent Labs  Lab 05/28/24 1629 05/29/24 0340  AST 32 27  ALT 14 11  ALKPHOS 157* 133*  BILITOT 0.2 0.2  PROT 8.8* 7.4  ALBUMIN 3.1* 2.6*   No results for input(s): LIPASE, AMYLASE in the last 168 hours. No results for input(s): AMMONIA in the last 168 hours.  ABG No results found for: PHART, PCO2ART, PO2ART, HCO3, TCO2, ACIDBASEDEF, O2SAT   Coagulation Profile: Recent Labs  Lab 05/28/24 1629  INR 1.1    Cardiac Enzymes: No results for input(s): CKTOTAL, CKMB, CKMBINDEX, TROPONINI in the last 168 hours.  HbA1C: No results found for: HGBA1C  CBG: No results for input(s): GLUCAP in the last 168 hours.  Review of Systems:   As above  Past Medical History:  She,  has no past medical history on file.   Surgical History:   Past Surgical History:  Procedure Laterality Date   AUGMENTATION MAMMAPLASTY Bilateral    Subglandular, pt's second set of implants   BREAST ENHANCEMENT SURGERY     let rotator cuff surgery  08/2022   PARTIAL HYSTERECTOMY       Social History:   reports that she quit smoking about 40 years ago. Her smoking use included cigarettes. She has never used smokeless tobacco. She reports current alcohol use. She reports that she does not currently use drugs.   Family History:  Her family  history includes Alzheimer's disease in her mother; Breast cancer in her half-sister; Colon cancer in her maternal grandmother.   Allergies Allergies[1]   Home Medications  Prior to Admission medications  Medication Sig Start Date End Date Taking? Authorizing Provider  amoxicillin -clavulanate (AUGMENTIN ) 875-125 MG tablet Take 1 tablet by mouth every 12 (twelve) hours. 05/26/24   [name], MD  azithromycin  (ZITHROMAX  Z-PAK) 250 MG tablet Take two pills today followed by one a day until gone 05/26/24   [name], MD  chlorpheniramine-HYDROcodone  (TUSSIONEX) 10-8 MG/5ML Take 5 mLs by mouth  every 12 (twelve) hours as needed for cough. 05/26/24   [name], MD  Cholecalciferol (VITAMIN D3) 1.25 MG (50000 UT) TABS Take by mouth.    [provider]  diphenhydrAMINE (BENADRYL) 25 MG tablet Take 25 mg by mouth every 6 (six) hours as needed.    [provider]  Multiple Vitamin (MULTIVITAMIN) tablet Take 1 tablet by mouth daily.    [provider]  Omega-3 Fatty Acids (FISH OIL) 1000 MG CAPS Take by mouth.    [provider]  pyridOXINE (B-6) 50 MG tablet Take 50 mg by mouth daily.    [provider]  Turmeric (QC TUMERIC COMPLEX) 500 MG CAPS Take by mouth.    [provider]  valACYclovir  (VALTREX ) 500 MG tablet Take 1 tablet by mouth twice a day for 3 days. 12/11/22   [name], MD  vitamin E 200 UNIT capsule Take 200 Units by mouth daily.    [provider]  Zinc 100 MG TABS Take by mouth.    [provider]        I personally spent 45 minutes providing critical care not including any separately billable procedures.  Marny Patch, MD [facility] 05/29/2024 11:39 AM                [1]  Allergies Allergen Reactions   Codeine Itching   "

## 2024-05-29 NOTE — Progress Notes (Signed)
 Assisted Dr. Adrien Guan with placement of R pigtail chest tube. Consent signed, time out completed, provider privileges reviewed. VSS, see flowsheets for documentation. Samples collected and walked to lab. 1.3L drained, clamped per MD for patient comfort. Bedside RN notified.

## 2024-05-29 NOTE — Procedures (Addendum)
 Insertion of Chest Tube Procedure Note  Jodi Cameron  [id number]  03-29-1956  Date:05/29/24  Time:12:05 PM    Provider Performing: Winfred Marny LITTIE Adrien Winfred   Procedure: Chest Tube Insertion [id number])  Indication(s) Effusion  Consent Risks of the procedure as well as the alternatives and risks of each were explained to the patient and/or caregiver.  Consent for the procedure was obtained and is signed in the bedside chart  Anesthesia Topical only with 1% lidocaine   - 5ml  Time Out Verified patient identification, verified procedure, site/side was marked, verified correct patient position, special equipment/implants available, medications/allergies/relevant history reviewed, required imaging and test results available.  Sterile Technique Maximal sterile technique including full sterile barrier drape, hand hygiene, sterile gown, sterile gloves, mask, hair covering, sterile ultrasound probe cover (if used).   Procedure Description Ultrasound used to identify appropriate pleural anatomy for placement and overlying skin marked. Area of placement cleaned and draped in sterile fashion.  A 14 French pigtail pleural catheter was placed into the right pleural space using. Appropriate return of serosanguineous fluid was obtained.  The tube was connected to atrium and placed on water seal. It was drained a total of 1.5 L.   Complications/Tolerance None; patient tolerated the procedure well. Chest X-ray is ordered to verify placement- correct chest tube placement.   EBL Minimal  Specimen(s) fluid - chemistry, culture, cell count, and cytology

## 2024-05-30 ENCOUNTER — Inpatient Hospital Stay (HOSPITAL_COMMUNITY)

## 2024-05-30 DIAGNOSIS — J90 Pleural effusion, not elsewhere classified: Secondary | ICD-10-CM | POA: Diagnosis not present

## 2024-05-30 LAB — LD, BODY FLUID (OTHER)
LD, Body Fluid: 1722 IU/L
Source of Sample: 100156

## 2024-05-30 LAB — VITAMIN B12: Vitamin B-12: 714 pg/mL (ref 180–914)

## 2024-05-30 LAB — COMPREHENSIVE METABOLIC PANEL WITH GFR
ALT: 8 U/L (ref 0–44)
AST: 23 U/L (ref 15–41)
Albumin: 2.6 g/dL — ABNORMAL LOW (ref 3.5–5.0)
Alkaline Phosphatase: 124 U/L (ref 38–126)
Anion gap: 7 (ref 5–15)
BUN: 12 mg/dL (ref 8–23)
CO2: 27 mmol/L (ref 22–32)
Calcium: 8.9 mg/dL (ref 8.9–10.3)
Chloride: 95 mmol/L — ABNORMAL LOW (ref 98–111)
Creatinine, Ser: 0.63 mg/dL (ref 0.44–1.00)
GFR, Estimated: >60 mL/min
Glucose, Bld: 103 mg/dL — ABNORMAL HIGH (ref 70–99)
Potassium: 4.4 mmol/L (ref 3.5–5.1)
Sodium: 129 mmol/L — ABNORMAL LOW (ref 135–145)
Total Bilirubin: 0.2 mg/dL (ref 0.0–1.2)
Total Protein: 7.3 g/dL (ref 6.5–8.1)

## 2024-05-30 LAB — PROTEIN, BODY FLUID (OTHER)
Source of Sample: 19588
Total Protein, Body Fluid Other: 5.5 g/dL

## 2024-05-30 LAB — GLUCOSE, BODY FLUID OTHER
Glucose, Body Fluid Other: 5 mg/dL
Source of Sample: 19497

## 2024-05-30 MED ORDER — ENSURE PLUS HIGH PROTEIN PO LIQD
237.0000 mL | Freq: Two times a day (BID) | ORAL | Status: DC
  Administered 2024-05-31 – 2024-06-02 (×5): 237 mL via ORAL

## 2024-05-30 MED ORDER — SODIUM CHLORIDE 0.9 % IV SOLN: INTRAVENOUS | Status: AC

## 2024-05-30 MED ORDER — HEPARIN SODIUM (PORCINE) 5000 UNIT/ML IJ SOLN
5000.0000 [IU] | Freq: Three times a day (TID) | INTRAMUSCULAR | Status: DC
  Administered 2024-05-31 – 2024-06-02 (×6): 5000 [IU] via SUBCUTANEOUS
  Filled 2024-05-30 (×6): qty 1

## 2024-05-30 NOTE — Progress Notes (Signed)
 " [facility] Progress Note  Patient: Jodi Cameron     FMW:[id number]  DOA: 05/28/2024   PCP: Wheeler Harlene CROME, NP       Brief hospital course: 69 y/o F with tobacco abuse presented with dyspnea. Shew as recently given Augmentin  and Z pak but symptoms did not improve.   In the ED CT shows:   Large R pleural effusion with complete collapse of R lung, left lower neck lymphadenopathy with mass affect on trachea and lymphadenopathy in mediastinum, retrocrural area and superior left retroperitoneal area.   Noted to have dyspnea and tachypnea but no hyoxia.   Subjective:  No complaints. We discussed her CT findings and she states she had felt that there was something wrong as she had not been feeling well for a few months.   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 2. Pelvic, retroperitoneal, and retrocrural nodal metastases. 3. Peritoneal disease, as above, with small volume pelvic ascites. 4. Moderate right pleural effusion with indwelling pleural drain. Lymphadenopathy, cervical, mediastinal, retroperitoneal and retrocrural - she has had a hysterectomy - IR consult for biopsy of cervical LN - Gyn onc, Dr LeonceGLENWOOD Ada, consulted today and recommended to order CEA and CA 125 - 4,580 cc in 1/17  Nicotine abuse - counseling done  Hyponatremia - U sodium < 30, Sosm 285, Uosm 250 - cont IVF  Hypoalbuminemia - Albumin 2.6   Elevated alk phos - follow- may be related to lymphadenopathy  Anemia - Hgb 12.6> 10.9 - iron sat 9, TIBC 190, Ferritin 747,  MCV normal- AOCD  Thrombocytosis - follow      Code Status: Full Code Total time on patient care: 35 min DVT prophylaxis:  heparin  injection 5,000 Units Start: 05/31/24 2200    Objective:   Vitals:   05/29/24 1127 05/29/24 2312 05/30/24 0434 05/30/24 1452  BP: 120/87 115/73 120/75 114/87  Pulse: (!) 110 (!) 107 93 92  Resp: 14 18 18 15   Temp: 97.8 F (36.6 C) 98.6 F (37 C) (!) 97.3 F (36.3 C) 97.8 F (36.6 C)  TempSrc:   Oral Oral  SpO2:  94% 97% 99%  Weight:      Height:       Filed Weights   05/28/24 1626  Weight: 63.5 kg   Exam: General exam: Appears comfortable  HEENT: oral mucosa moist Respiratory system: poor breath sounds in right lung field, cough Cardiovascular system: S1 & S2 heard  Gastrointestinal system: Abdomen soft, non-tender, nondistended. Normal bowel sounds   Extremities: No cyanosis, clubbing or edema Psychiatry:  Mood & affect appropriate.    CBC: Recent Labs  Lab 05/28/24 1629 05/29/24 0026 05/29/24 0340  WBC 10.5 10.4 10.2  NEUTROABS 6.4  --   --   HGB 12.6 11.8* 10.9*  HCT 39.2 36.8 34.0*  MCV 83.2 84.2 83.5  PLT 598* 557* 531*   Basic Metabolic Panel: Recent Labs  Lab 05/28/24 1629 05/29/24 0026 05/29/24 0340 05/30/24 0355  NA 132*  --  133* 129*  K 4.7  --  4.1 4.4  CL 96*  --  98 95*  CO2 26  --  27 27  GLUCOSE 110*  --  135* 103*  BUN 18  --  15 12  CREATININE 0.61 0.56 0.61 0.63  CALCIUM 9.3  --  8.8* 8.9     Scheduled Meds:  azithromycin   500 mg Oral Daily   fluticasone   2 spray Each Nare Daily   [START ON 05/31/2024] heparin   5,000 Units Subcutaneous Q8H   sodium chloride  flush  10 mL Intrapleural Q8H   zolpidem   5 mg Oral QHS    Imaging and lab data personally reviewed   Author: Naoki Migliaccio  05/30/2024 3:55 PM  To contact [facility]>   Check the care team in [facility] and look for the attending/consulting [name] provider listed  Log into [email or web address] and use [facility] universal password   Go  to> [facility]  and find provider  If you still have difficulty reaching the provider, please page the [facility] (Director on Call) for the Hospitalists listed on amion     "

## 2024-05-31 ENCOUNTER — Inpatient Hospital Stay (HOSPITAL_COMMUNITY)

## 2024-05-31 DIAGNOSIS — J90 Pleural effusion, not elsewhere classified: Secondary | ICD-10-CM | POA: Diagnosis not present

## 2024-05-31 DIAGNOSIS — F17210 Nicotine dependence, cigarettes, uncomplicated: Secondary | ICD-10-CM | POA: Diagnosis not present

## 2024-05-31 DIAGNOSIS — R1900 Intra-abdominal and pelvic swelling, mass and lump, unspecified site: Secondary | ICD-10-CM | POA: Diagnosis not present

## 2024-05-31 DIAGNOSIS — R599 Enlarged lymph nodes, unspecified: Secondary | ICD-10-CM | POA: Diagnosis not present

## 2024-05-31 DIAGNOSIS — C786 Secondary malignant neoplasm of retroperitoneum and peritoneum: Secondary | ICD-10-CM

## 2024-05-31 HISTORY — PX: IR US LIVER BIOPSY: IMG936

## 2024-05-31 LAB — BASIC METABOLIC PANEL WITH GFR
Anion gap: 6 (ref 5–15)
BUN: 15 mg/dL (ref 8–23)
CO2: 30 mmol/L (ref 22–32)
Calcium: 8.9 mg/dL (ref 8.9–10.3)
Chloride: 99 mmol/L (ref 98–111)
Creatinine, Ser: 0.55 mg/dL (ref 0.44–1.00)
GFR, Estimated: >60 mL/min
Glucose, Bld: 124 mg/dL — ABNORMAL HIGH (ref 70–99)
Potassium: 4.1 mmol/L (ref 3.5–5.1)
Sodium: 135 mmol/L (ref 135–145)

## 2024-05-31 MED ORDER — SENNOSIDES-DOCUSATE SODIUM 8.6-50 MG PO TABS
2.0000 | ORAL_TABLET | Freq: Once | ORAL | Status: AC
  Administered 2024-05-31: 2 via ORAL
  Filled 2024-05-31: qty 2

## 2024-05-31 MED ORDER — LIDOCAINE-EPINEPHRINE 1 %-1:100000 IJ SOLN
20.0000 mL | Freq: Once | INTRAMUSCULAR | Status: AC
  Administered 2024-05-31: 3 mL via INTRADERMAL

## 2024-05-31 MED ORDER — MIDAZOLAM HCL (PF) 2 MG/2ML IJ SOLN
INTRAMUSCULAR | Status: AC | PRN
  Administered 2024-05-31 (×2): 1 mg via INTRAVENOUS

## 2024-05-31 MED ORDER — CARMEX CLASSIC LIP BALM EX OINT: 1.0000 | TOPICAL_OINTMENT | CUTANEOUS | Status: DC | PRN

## 2024-05-31 MED ORDER — SENNOSIDES-DOCUSATE SODIUM 8.6-50 MG PO TABS
1.0000 | ORAL_TABLET | Freq: Every day | ORAL | Status: DC
  Administered 2024-06-01 – 2024-06-02 (×2): 1 via ORAL
  Filled 2024-05-31 (×2): qty 1

## 2024-05-31 MED ORDER — FENTANYL CITRATE (PF) 100 MCG/2ML IJ SOLN
INTRAMUSCULAR | Status: AC | PRN
  Administered 2024-05-31 (×2): 50 ug via INTRAVENOUS

## 2024-05-31 MED ORDER — FENTANYL CITRATE (PF) 100 MCG/2ML IJ SOLN
INTRAMUSCULAR | Status: AC
  Filled 2024-05-31: qty 2

## 2024-05-31 MED ORDER — MIDAZOLAM HCL 2 MG/2ML IJ SOLN
INTRAMUSCULAR | Status: AC
  Filled 2024-05-31: qty 2

## 2024-05-31 MED ORDER — LIDOCAINE-EPINEPHRINE 1 %-1:100000 IJ SOLN
INTRAMUSCULAR | Status: AC
  Filled 2024-05-31: qty 20

## 2024-05-31 NOTE — Consult Note (Signed)
 Reason for Consult: Pelvic mass/pleural effusion Referring Physician: True Atlas, MD  Jodi Cameron is an 69 y.o. female.  HPI: She initially presented to ED 3d ago w/cough.  She endorsed general malaise, anorexia, fatigue, early satiety, difficulty swallowing and nasal congestion x 1 mth.  No vomiting, but she feels full quickly after eating, experiences frequent urination, and bloating. She also reports constipation, which she attempted to address with a cleanse after returning from a trip in August 2025. She has lost 32 pounds intentionally since June 2024.She was recently treated w/an outpt regimen for pneumonia.  She later presented to her PCP and was found to be tachypneic and tachycardic.    She has experienced sharp stomach cramps for at least two years, often waking her at night. Initially sporadic, occurring once a month or less, the cramps have worsened over time. She sought medical attention two years ago, but no diagnosis was made at that time.  She has a history of smoking for 30 years, with intermittent cessation, most recently quitting for nine months before resuming after her trip.  She underwent a hysterectomy due to fibroids after the birth of her son, Zach, who is now 66 years old. Her ovaries were left conserved. No vaginal bleeding. She reports a sensation of 'feeling hollow' over the past two weeks, describing it as feeling like something is dropping inside her, although there is no actual dropping sensation.  A MMG in 7/24 was benign. There is a family h/o breast cancer.   During this admission, a exam was remarkable for left-sided cervical lymphadenopathy w/secondary tracheal narrowing. She is s/p chest tube placement for a large right pleural effusion causing a collapsed lung.    History reviewed. No pertinent past medical history.  Past Surgical History:  Procedure Laterality Date   AUGMENTATION MAMMAPLASTY Bilateral    Subglandular, pt's second set of implants    BREAST ENHANCEMENT SURGERY     let rotator cuff surgery  08/2022   PARTIAL HYSTERECTOMY      Family History  Problem Relation Age of Onset   Alzheimer's disease Mother    Colon cancer Maternal Grandmother    Breast cancer Half-Sister     Social History:  reports that she quit smoking about 40 years ago. Her smoking use included cigarettes. She has never used smokeless tobacco. She reports current alcohol use. She reports that she does not currently use drugs.  Allergies: Allergies[1]  Medications: I have reviewed the patient's current medications.  Results for orders placed or performed during the hospital encounter of 05/28/24 (from the past 48 hours)  Body fluid culture w Gram Stain     Status: None (Preliminary result)   Collection Time: 05/29/24 11:13 AM   Specimen: Pleura; Body Fluid  Result Value Ref Range   Specimen Description      PLEURAL Performed at [facility], [address] [place], [place] [address]    Special Requests      NONE Performed at [facility], [address], [place], [place] [address]    Gram Stain      RARE WBC PRESENT, PREDOMINANTLY MONONUCLEAR NO ORGANISMS SEEN    Culture      NO GROWTH 2 DAYS Performed at [facility], [address] [place], [place] [address]    Report Status PENDING   Culture, fungus without smear     Status: None (Preliminary result)   Collection Time: 05/29/24 11:13 AM   Specimen: Pleura; Other  Result Value Ref Range   Specimen Description      PLEURAL Performed at [facility], [address], [place], [place] [address]    Special Requests      NONE Performed at [facility], [address], [place], [place] [address]    Culture      NO FUNGUS ISOLATED AFTER 1 DAY Performed at [facility], [address] [place], [place] [address]    Report Status PENDING   Body fluid cell count with differential     Status:  Abnormal   Collection Time: 05/29/24 11:13 AM  Result Value Ref Range   Fluid Type-FCT PLEURAL    Color, Fluid RED (A) YELLOW   Appearance, Fluid CLOUDY (A) CLEAR   Total Nucleated Cell Count, Fluid 4,863 (H) 0 - 1,000 cu mm   Neutrophil Count, Fluid 12 0 - 25 %   Lymphs, Fluid 62 %   Monocyte-Macrophage-Serous Fluid 24 (L) 50 - 90 %   Eos, Fluid 2 %    Comment: Performed at [facility], [address] [place], [place] [address]  LD, Body Fluid (other)     Status: None   Collection Time: 05/29/24 11:13 AM  Result Value Ref Range   Source of Sample 100,[id number]     Comment: Performed at [facility], [address], [place], [place] [address]   LD, Body Fluid [phone] IU/L    Comment: (NOTE)             _________________________________________            : BODY FLUID TYPE :          LDH          :            :_________________:_______________________:            :                 : Nonmalignant:  < 60%  :            :                 :  of the serum LDH     :            : Ascitic Fluid   : Malignant:     > 60%  :            :                 :  of the serum LDH     :            :_________________:_______________________:            : Gastric Juice   :                < 35   :            :_________________:_______________________:            :                 : Transudate:    <200   :            : Pleural Fluid   : Exudate:       >200   :            :_________________:_______________________:            :  Saliva          :           113 - 609   :            : (Mixed Glands)  :                       :            :_________________:_______________________:            : Synovial Fluid  :                <240   :            :_________________:___________ ____________:             Bronwen ORN, Gwynn GAILS. Reference Intervals             for Adults and Children 2008. Ninth             Edition (V9.1) [employer],             [place]; [place]:  July 2009. Results confirmed on dilution. Performed At: [facility] [address] [place], [place] [id number] Jennette Shorter MD Ey:[id number]   Glucose, Body Fluid Other     Status: None   Collection Time: 05/29/24 11:13 AM  Result Value Ref Range   Glucose, Body Fluid Other 5 mg/dL    Comment: (NOTE)             _________________________________________            : BODY FLUID TYPE :        GLUCOSE        :            :_________________:_______________________:            : Amniotic Fluid  :        45 - 76        :            :_________________:_______________________:            : Bile, Clear     :            < 5        :            :_________________:_______________________:            : Bile, Yellow    :            < 8        :            :_________________:_______________________:            : Lymph           :        48 - 200       :            :_________________:_______________________:            : Nasal Secretion :            < 10       :            :_________________:_______________________:            : Pleural Fluid   :        65 -  99       :            :  _________________:_______________________:            : Saliva          :            <  2       :            : (Mixed Glands)  :                       :            :_________________:___________ ____________:            : Sweat           :            <  7       :            :_________________:_______________________:            : Synovial Fluid  :        65 -  99       :            :_________________:_______________________:            : Tears           :        76 - 288       :            :_________________:_______________________:             Bronwen ORN, Ehrhardt V. Reference Intervals             for Adults and Children 2008. Ninth             edition (V9.1) [employer],             [place]; [place]: July 2009. Performed At: [facility] [address] [place], [place]  [id number] Jennette Shorter MD Ey:[id number]    Source of Sample [phone]     Comment: Performed at [facility], [address], [place], [place] [address]  Protein, body fluid (other)     Status: None   Collection Time: 05/29/24 11:13 AM  Result Value Ref Range   Total Protein, Body Fluid Other 5.5 g/dL    Comment: (NOTE)             _________________________________________            : BODY FLUID TYPE :     TOTAL PROTEIN     :            :_________________:_______________________:            : Amniotic Fluid  :               <0.4    :            :_________________:_______________________:            :                 : Nonmalignant: <3.0    :            : Ascitic Fluid   : Malignant:    >3.0    :            :_________________:_______________________:            : Bile, Clear     :               <  0.9    :            :_________________:_______________________:            : Bile, Yellow    :          0.2 - 0.6    :            :_________________:_______________________:            : Lymph           :          2.2 - 6.0    :            :_________________:_______________________:            : Human Milk      :          1.9 - 2.0    :            :_________________: ______________________:            : Nasal Secretion :          0.1 - 3.5    :            :_________________:___________ ____________:            : Pancreatic      :          0.0 - 0.1    :            : Juice           :    (post stimulation) :            :_________________:_______________________:            :                 : Transudate:   <3.0    :            : Pleural Fluid   : Exudate:      >3.0    :            :_________________:_______________________:            : Saliva          :          0.1 - 0.2    :            : (Mixed Glands)  :                       :            :_________________:_______________________:            : Synovial Fluid  :               <2.5    :             :_________________:_______________________:            : Tears           :          0.8 - 0.9    :            :_________________:_______________________:             Bronwen ORN, Gwynn [place] Reference Intervals             for  Adults and Children 2008. Ninth  Edition (V9.1) [employer],             [place]; [place]: July 2009. Performed At: [facility] [address] [place], [place] [id number] Jennette Shorter MD Ey:[id number]    Source of Sample [phone]     Comment: Performed at [facility], [address] [place], [place] [address]  Folate     Status: None   Collection Time: 05/29/24  1:51 PM  Result Value Ref Range   Folate 11.0 >5.9 ng/mL    Comment: Performed at [facility], [address], [place], [place] [address]  Iron and TIBC     Status: Abnormal   Collection Time: 05/29/24  1:51 PM  Result Value Ref Range   Iron 17 (L) 28 - 170 ug/dL   TIBC 809 (L) 749 - 549 ug/dL   Saturation Ratios 9 (L) 10.4 - 31.8 %   UIBC 173 ug/dL    Comment: Performed at [facility], [address] [place], [place] [address]  Ferritin     Status: Abnormal   Collection Time: 05/29/24  1:51 PM  Result Value Ref Range   Ferritin 747 (H) 11 - 307 ng/mL    Comment: Performed at [facility], [address] [place], [place] [address]  Reticulocytes     Status: None   Collection Time: 05/29/24  1:51 PM  Result Value Ref Range   Retic Ct Pct 0.7 0.4 - 3.1 %   RBC. 4.24 3.87 - 5.11 MIL/uL   Retic Count, Absolute 28.8 19.0 - 186.0 K/uL   Immature Retic Fract 5.2 2.3 - 15.9 %    Comment: Performed at [facility], [address], [place], [place] [address]  Osmolality     Status: None   Collection Time: 05/29/24  1:51 PM  Result Value Ref Range   Osmolality 285 275 - 295 mOsm/kg    Comment: Performed at [facility], [address], [place], [place] [address]   Sodium, urine, random     Status: None   Collection Time: 05/29/24  4:26 PM  Result Value Ref Range   Sodium, Ur <30 mmol/L    Comment: NO NORMAL RANGE ESTABLISHED FOR THIS TEST Performed at [facility], [address], [place], [place] [address]   Osmolality, urine     Status: Abnormal   Collection Time: 05/29/24  4:26 PM  Result Value Ref Range   Osmolality, Ur 250 (L) 300 - 900 mOsm/kg    Comment: Performed at [facility], [address], [place], [place] [address]  Comprehensive metabolic panel     Status: Abnormal   Collection Time: 05/30/24  3:55 AM  Result Value Ref Range   Sodium 129 (L) 135 - 145 mmol/L   Potassium 4.4 3.5 - 5.1 mmol/L   Chloride 95 (L) 98 - 111 mmol/L   CO2 27 22 - 32 mmol/L   Glucose, Bld 103 (H) 70 - 99 mg/dL    Comment: Glucose reference range applies only to samples taken after fasting for at least 8 hours.   BUN 12 8 - 23 mg/dL   Creatinine, Ser 9.36 0.44 - 1.00 mg/dL   Calcium 8.9 8.9 - 89.6 mg/dL   Total Protein 7.3 6.5 - 8.1 g/dL   Albumin 2.6 (L) 3.5 - 5.0 g/dL   AST 23 15 - 41 U/L   ALT 8 0 - 44 U/L   Alkaline  Phosphatase 124 38 - 126 U/L   Total Bilirubin 0.2 0.0 - 1.2 mg/dL   GFR, Estimated >39 >39 mL/min    Comment: (NOTE) Calculated using the CKD-EPI Creatinine Equation (2021)    Anion gap 7 5 - 15    Comment: Performed at [facility], [address], [place], [place] [address]  Vitamin B12     Status: None   Collection Time: 05/30/24  3:55 AM  Result Value Ref Range   Vitamin B-12 714 180 - 914 pg/mL    Comment: Performed at [facility], [address], [place], [place] [address]    DG CHEST PORT 1 VIEW Result Date: 05/30/2024 EXAM: 1 VIEW(S) XRAY OF THE CHEST 05/30/2024 05:30:00 AM COMPARISON: Portable chest yesterday at 11:44 am. CLINICAL HISTORY: Pleural effusion. FINDINGS: LINES, TUBES AND DEVICES: There is a pigtail right chest tube with the pigtail in the  medial base of the right thorax. LUNGS AND PLEURA: There is moderate right pleural effusion, either improved or redistributed . There is Patchy airspace disease in the right mid lung overlying the effusion, and consolidation or atelectasis in the adjacent right base. The remainder of the lungs are clear. Moderate right pleural effusion remains, either improved or redistributed. No pneumothorax. HEART AND MEDIASTINUM: No acute abnormality of the cardiac and mediastinal silhouettes. BONES AND SOFT TISSUES: No acute osseous abnormality. IMPRESSION: 1. Moderate right pleural effusion, either improved or redistributed, with a pigtail right chest tube in place. 2. Patchy airspace disease in the right mid lung overlying the effusion, and consolidation or atelectasis in the adjacent right base. Electronically signed by: Francis Quam MD 05/30/2024 06:00 AM EST RP Workstation: [id number]   CT ABDOMEN PELVIS W CONTRAST Result Date: 05/29/2024 EXAM: CT ABDOMEN AND PELVIS WITH CONTRAST 05/29/2024 04:58:02 PM TECHNIQUE: CT of the abdomen and pelvis was performed with the administration of 100 mL of iohexol  (OMNIPAQUE ) 300 MG/ML solution. Multiplanar reformatted images are provided for review. Automated exposure control, iterative reconstruction, and/or weight-based adjustment of the mA/kV was utilized to reduce the radiation dose to as low as reasonably achievable. COMPARISON: Partial comparison CT chest dated 05/28/2024. CLINICAL HISTORY: Metastatic disease evaluation. FINDINGS: LOWER CHEST: Moderate right pleural effusion with indwelling pleural drain. Right middle lobe and right lower lobe atelectasis/collapse. Retrocrural nodal metastases measuring up to 3.0 cm on the right (image 14). LIVER: Subcentimeter posterior right hepatic cyst, benign. GALLBLADDER AND BILE DUCTS: Gallbladder is unremarkable. No biliary ductal dilatation. SPLEEN: No acute abnormality. PANCREAS: No acute abnormality. ADRENAL GLANDS: No acute  abnormality. KIDNEYS, URETERS AND BLADDER: No stones in the kidneys or ureters. No hydronephrosis. No perinephric or periureteral stranding. Urinary bladder is unremarkable. GI AND BOWEL: Stomach demonstrates no acute abnormality. Additional peritoneal disease in the left upper abdomen adjacent to the stomach and spleen (image 20). There is no bowel obstruction. PERITONEUM AND RETROPERITONEUM: Small volume pelvic ascites. No free air. 8.4 x 4.7 cm mass beneath the left lower anterior abdominal wall (image 61), and additional 7.0 x 4.5 cm mass beneath the midline anterior abdominal wall (image 50), suspicious for peritoneal disease. VASCULATURE: Aorta is normal in caliber. LYMPH NODES: Pelvic/retroperitoneal nodal metastases, including a dominant 3.3 cm left periaortic node (image 34). REPRODUCTIVE ORGANS: Large, lobulated pelvic mass inseparable from the uterus measuring 14.7 x 12.6 cm (image 68), compatible with uterine malignancy. BONES AND SOFT TISSUES: No acute osseous abnormality. Bilateral breast augmentation. IMPRESSION: 1. Large, lobulated pelvic mass inseparable from the uterus measuring 14.7 cm, compatible with  uterine malignancy. 2. Pelvic, retroperitoneal, and retrocrural nodal metastases. 3. Peritoneal disease, as above, with small volume pelvic ascites. 4. Moderate right pleural effusion with indwelling pleural drain. Electronically signed by: Pinkie Pebbles MD 05/29/2024 08:12 PM EST RP Workstation: [id number]   DG CHEST PORT 1 VIEW Result Date: 05/29/2024 EXAM: 1 VIEW(S) XRAY OF THE CHEST 05/29/2024 11:47:00 AM COMPARISON: 05/28/2024 CLINICAL HISTORY: Encounter for chest tube placement FINDINGS: LINES, TUBES AND DEVICES: Right chest tube with pigtail overlying the medial right lower hemithorax. LUNGS AND PLEURA: Large right pleural effusion, decreased compared to prior exam. No focal pulmonary opacity. No pneumothorax. HEART AND MEDIASTINUM: No acute abnormality of the cardiac and mediastinal  silhouettes. BONES AND SOFT TISSUES: Cystic changes in the right glenoid, likely degenerative. IMPRESSION: 1. Right chest tube with pigtail overlying the medial right lower hemithorax. 2. Large right pleural effusion, decreased. Electronically signed by: Waddell Calk MD 05/29/2024 12:00 PM EST RP Workstation: [id number]    Review of Systems  Constitutional:  Positive for fatigue.  Gastrointestinal: Negative.   Genitourinary:  Positive for frequency. Negative for vaginal bleeding.   Blood pressure 120/77, pulse 91, temperature 98.2 F (36.8 C), resp. rate (!) 42, height 5' 6 (1.676 m), weight 63.5 kg, SpO2 96%. Physical Exam Constitutional:      Comments: thin  Neck:     Comments: Left Band-Aid present  Abdominal:     Comments: Firm, mild distension.  NT. No discrete mass  Musculoskeletal:     Right lower leg: No edema.     Left lower leg: No edema.  Skin:    General: Skin is warm and dry.  Neurological:     General: No focal deficit present.     Mental Status: She is alert.  Psychiatric:        Mood and Affect: Mood normal.     Assessment/Plan: Pt w/a large pelvic mass, peritoneal disease, diffuse lymphadenopathy and a pleural effusion.  DDX is broad--thoracic primaries (lung), lymphomas, and advanced gynecologic malignancy in the setting of chronic tobacco abuse. Tumor markers and cytology from the pleurocentesis are pending.  S/P IR guided bx of an enlarged cervical node  - Review the results of the work up in progress - Will follow with you  [place] 05/31/2024, 9:23 AM         [1]  Allergies Allergen Reactions   Codeine Itching

## 2024-05-31 NOTE — Progress Notes (Addendum)
 " [facility] Progress Note  Patient: Jodi Cameron     FMW:[id number]  DOA: 05/28/2024   PCP: Wheeler Harlene CROME, NP       Brief hospital course: 69 y/o F with tobacco abuse presented with dyspnea. She was recently given Augmentin  and Z pak but symptoms did not improve.   In the ED CT shows:   Large R pleural effusion with complete collapse of R lung, left lower neck lymphadenopathy with mass affect on trachea and lymphadenopathy in mediastinum, retrocrural area and superior left retroperitoneal area.   Noted to have dyspnea and tachypnea but no hypoxia.   Subjective:  Oral intake is good. Pain in chest tube area controlled with medication. Did not sleep well due to commotion in hall last night. No BM in a couple of days. Nasal discharge resolved. No cough.   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 2. Pelvic, retroperitoneal, and retrocrural nodal metastases. 3. Peritoneal disease, as above, with small volume pelvic ascites. 4. Moderate right pleural effusion with indwelling pleural drain. Lymphadenopathy, cervical, mediastinal, retroperitoneal and retrocrural - she has had a hysterectomy but has fallopian tubes and ovaries - Gyn onc, Dr LeonceGLENWOOD Ada, consulted and recommended to order CEA and CA 125 which are pending - pleural fluid> 4,580 cc in 1/17, 250 cc on 1/18 - pleural fluid culture neg - L cervical LN biopsy done today  Nicotine abuse - counseling done  Hyponatremia - U sodium < 30, Sosm 285, Uosm 250 - resolved with IVF- dc fluids and  follow  PND - Flonase  has helped  Hypoalbuminemia - due to chronic illness  - Albumin 2.6 - cont Ensure BID- she states she is eating well  Elevated alk phos - follow- may be related to lymphadenopathy  Anemia - Hgb 12.6> 10.9 - iron sat 9, TIBC 190, Ferritin 747, MCV normal- AOCD  Thrombocytosis - follow  Insomnia - Ambien  5 mg is helping  Constipation - Senna   Obtain PT eval now that dyspnea improved. She has been ambulating in the room but is very weak.      Code Status: Full Code Total time on patient care: 35 min DVT prophylaxis:  heparin  injection 5,000 Units Start: 05/31/24 2200    Objective:   Vitals:   05/31/24 0920 05/31/24 1000 05/31/24 1133 05/31/24 1134  BP: 116/78   114/78  Pulse: 94   (!) 107  Resp: (!) 27 15  18   Temp:    97.6 F (36.4 C)  TempSrc:    Oral  SpO2: 96% 95% 95% 93%  Weight:      Height:       Filed Weights   05/28/24 1626  Weight: 63.5 kg   Exam: General exam: Appears comfortable  HEENT: oral mucosa moist Respiratory system: poor breath sounds in right lung field, cough Cardiovascular system: S1 & S2 heard  Gastrointestinal system: Abdomen soft, non-tender, nondistended. Normal bowel sounds   Extremities: No cyanosis, clubbing or edema Psychiatry:  Mood & affect appropriate.    CBC: Recent Labs  Lab 05/28/24 1629 05/29/24 0026 05/29/24  0340  WBC 10.5 10.4 10.2  NEUTROABS 6.4  --   --   HGB 12.6 11.8* 10.9*  HCT 39.2 36.8 34.0*  MCV 83.2 84.2 83.5  PLT 598* 557* 531*   Basic Metabolic Panel: Recent Labs  Lab 05/28/24 1629 05/29/24 0026 05/29/24 0340 05/30/24 0355  NA 132*  --  133* 129*  K 4.7  --  4.1 4.4  CL 96*  --  98 95*  CO2 26  --  27 27  GLUCOSE 110*  --  135* 103*  BUN 18  --  15 12  CREATININE 0.61 0.56 0.61 0.63  CALCIUM 9.3  --  8.8* 8.9     Scheduled Meds:  azithromycin   500 mg Oral Daily   feeding supplement  237 mL Oral BID BM   fluticasone   2 spray Each Nare Daily   heparin    5,000 Units Subcutaneous Q8H   [START ON 06/01/2024] senna-docusate  1 tablet Oral QHS   sodium chloride  flush  10 mL Intrapleural Q8H   zolpidem   5 mg Oral QHS    Imaging and lab data personally reviewed   Author: Sentoria Brent  05/31/2024 12:24 PM  To contact [facility]>   Check the care team in [facility] and look for the attending/consulting [name] provider listed  Log into [email or web address] and use [facility]'s universal password   Go to> [facility]  and find provider  If you still have difficulty reaching the provider, please page the [facility] (Director on Call) for the Hospitalists listed on amion     "

## 2024-05-31 NOTE — Procedures (Signed)
 Interventional Radiology Procedure:   Indications: Metastatic disease and needs tissue diagnosis  Procedure: Ultrasound guided biopsy of left supraclavicular lymph node  Findings: Enlarged left supraclavicular nodes.  Core biopsies obtained and placed on telfa pad with saline.    Complications: None     EBL: Minimal  Plan: Bedrest 1 hour   Ac Colan R. Philip, MD  Pager: [phone]

## 2024-05-31 NOTE — Plan of Care (Signed)

## 2024-05-31 NOTE — Progress Notes (Signed)
 "  NAME:  Jodi Cameron, MRN:  [id number], DOB:  1955-05-16, LOS: 3 ADMISSION DATE:  05/28/2024, C History of Present Illness:  Ms. Berland is a 69 yo with significant PMHX of smoking 0.5-1ppd and she has quitted 3 weeks ago.  She started having 4 months ago some GI issues, and since 4 days ago, she start experiencing severe shortness of breath with exertion. She went to the PCP and she noticed tachypneic, tachycardic and she was sent to the ER for evaluation. She had some weight loss related to her GI issues, but her weight is back to normal.  She has not f/u with pcp over the last 1.5 years. She has not had hx of COPD or following with pulmonary.   CT scan showed a large pleural effusion with complete collapse of the right lung, with rightward shift. Enlarged LL neck LN  with rightward shift, and severe narrowing of the trachea, concerning for metastatic disease. Extensive LAD involving mediastinum, retrocural region RP. Pulmonary was called for drainage of pleural fluid and possible biopsies. Pertinent  Medical History  Tobacco use   Significant Hospital Events: Including procedures, antibiotic start and stop dates in addition to other pertinent events     Interim History / Subjective:  Patient is feeling better. Chest tube in place.   Objective    Blood pressure 114/78, pulse (!) 107, temperature 97.6 F (36.4 C), temperature source Oral, resp. rate 18, height 5' 6 (1.676 m), weight 63.5 kg, SpO2 92%.        Intake/Output Summary (Last 24 hours) at 05/31/2024 1803 Last data filed at 05/31/2024 1700 Gross per 24 hour  Intake 1278.43 ml  Output 180 ml  Net 1098.43 ml   Filed Weights   05/28/24 1626  Weight: 63.5 kg    Examination: Physical Exam Constitutional:      Appearance: She is well-developed.  HENT:     Head: Normocephalic.  Cardiovascular:     Rate and Rhythm: Normal rate and regular rhythm.  Pulmonary:     Effort: Pulmonary effort is normal.     Comments: Breath  sounds improved in the R lung, normal on the left. Musculoskeletal:        General: Normal range of motion.  Neurological:     General: No focal deficit present.     Mental Status: She is alert and oriented to person, place, and time.    CT WO CON 05/29/2023 - large R pl;eural effusion with complete collapse of the R lung and significant mass effect and rightward shift of the cardiomediastinal structures. Enlarged Left lower neck LN with mass effect rightward shift and severe narrowing of the trachea. Concerning with metastatic disease.   Resolved problem list   Assessment and Plan   Large R pleural effusion with complete collapse of the R lung s/p chest tube Large lower neck LN with mass effect with tracheal narrowing Smoker 0.5-1ppd (quit 3 weeks ago) Abdominal mass concerning for malignancy   Patient presented with large pleural effusion and LAD concerning for malignancy. Chest tube was placed. Drainage has been slowing down.  IR perform a neck biopsy today.  Plan: -Chest tube on water seal> transitioning to suctioning to see if there is any improvement of the xray.  -NS flushing 10 cc to the atrium and 20 cc to patient q 8 hours. -pending chemistry, cx, cytology. -Daily CXR -IR for LN neck biopsy, pending results -pulmonary will continue to follow up for chest tube management.  Labs   CBC: Recent Labs  Lab 05/28/24 1629 05/29/24 0026 05/29/24 0340  WBC 10.5 10.4 10.2  NEUTROABS 6.4  --   --   HGB 12.6 11.8* 10.9*  HCT 39.2 36.8 34.0*  MCV 83.2 84.2 83.5  PLT 598* 557* 531*    Basic Metabolic Panel: Recent Labs  Lab 05/28/24 1629 05/29/24 0026 05/29/24 0340 05/30/24 0355 05/31/24 1159  NA 132*  --  133* 129* 135  K 4.7  --  4.1 4.4 4.1  CL 96*  --  98 95* 99  CO2 26  --  27 27 30   GLUCOSE 110*  --  135* 103* 124*  BUN 18  --  15 12 15   CREATININE 0.61 0.56 0.61 0.63 0.55  CALCIUM 9.3  --  8.8* 8.9 8.9   GFR: Estimated Creatinine Clearance: 64.8  mL/min (by C-G formula based on SCr of 0.55 mg/dL). Recent Labs  Lab 05/28/24 1629 05/29/24 0026 05/29/24 0340  WBC 10.5 10.4 10.2  LATICACIDVEN 1.3 0.9  --     Liver Function Tests: Recent Labs  Lab 05/28/24 1629 05/29/24 0340 05/30/24 0355  AST 32 27 23  ALT 14 11 8   ALKPHOS 157* 133* 124  BILITOT 0.2 0.2 0.2  PROT 8.8* 7.4 7.3  ALBUMIN 3.1* 2.6* 2.6*   No results for input(s): LIPASE, AMYLASE in the last 168 hours. No results for input(s): AMMONIA in the last 168 hours.  ABG No results found for: PHART, PCO2ART, PO2ART, HCO3, TCO2, ACIDBASEDEF, O2SAT   Coagulation Profile: Recent Labs  Lab 05/28/24 1629  INR 1.1    Cardiac Enzymes: No results for input(s): CKTOTAL, CKMB, CKMBINDEX, TROPONINI in the last 168 hours.  HbA1C: No results found for: HGBA1C  CBG: No results for input(s): GLUCAP in the last 168 hours.  Review of Systems:   As above  Past Medical History:  She,  has no past medical history on file.   Surgical History:   Past Surgical History:  Procedure Laterality Date   AUGMENTATION MAMMAPLASTY Bilateral    Subglandular, pt's second set of implants   BREAST ENHANCEMENT SURGERY     IR US  LIVER BIOPSY  05/31/2024   let rotator cuff surgery  08/2022   PARTIAL HYSTERECTOMY       Social History:   reports that she quit smoking about 40 years ago. Her smoking use included cigarettes. She has never used smokeless tobacco. She reports current alcohol use. She reports that she does not currently use drugs.   Family History:  Her family history includes Alzheimer's disease in her mother; Breast cancer in her half-sister; Colon cancer in her maternal grandmother.   Allergies Allergies[1]   Home Medications  Prior to Admission medications  Medication Sig Start Date End Date Taking? Authorizing Provider  amoxicillin -clavulanate (AUGMENTIN ) 875-125 MG tablet Take 1 tablet by mouth every 12 (twelve) hours. 05/26/24   Yes [name], MD  azithromycin  (ZITHROMAX  Z-PAK) 250 MG tablet Take two pills today followed by one a day until gone 05/26/24  Yes [name], MD  chlorpheniramine-HYDROcodone  (TUSSIONEX) 10-8 MG/5ML Take 5 mLs by mouth every 12 (twelve) hours as needed for cough. 05/26/24  Yes [name], MD  Cholecalciferol (VITAMIN D3) 1.25 MG (50000 UT) TABS Take by mouth.   Yes [provider]  Multiple Vitamin (MULTIVITAMIN) tablet Take 1 tablet by mouth daily.   Yes [provider]  Omega-3 Fatty Acids (FISH OIL) 1000 MG CAPS Take by mouth.  Yes [provider]  pyridOXINE (B-6) 50 MG tablet Take 50 mg by mouth daily.   Yes [provider]  Turmeric (QC TUMERIC COMPLEX) 500 MG CAPS Take by mouth.   Yes [provider]  valACYclovir  (VALTREX ) 500 MG tablet Take 1 tablet by mouth twice a day for 3 days. 12/11/22  Yes [name], MD  vitamin E 200 UNIT capsule Take 200 Units by mouth daily.   Yes [provider]  Zinc 100 MG TABS Take by mouth.   Yes [provider]       I personally spent 30 minutes providing care of this patient, not including any separately billable procedures.  Marny Patch, MD [facility] 05/31/2024 6:03 PM                [1]  Allergies Allergen Reactions   Codeine Itching   "

## 2024-05-31 NOTE — Progress Notes (Signed)
 Mobility Specialist - Progress Note  (RA) Pre-mobility: 112 bpm HR, 96% SpO2 During mobility: 124 bpm HR, 92% SpO2 Post-mobility: 117 bpm HR, 93% SPO2   05/31/24 1501  Mobility  Activity Ambulated with assistance  Level of Assistance Minimal assist, patient does 75% or more  Assistive Device Other (Comment) (HHA)  Distance Ambulated (ft) 160 ft  Range of Motion/Exercises Active  Activity Response Tolerated fair  Mobility visit 1 Mobility  Mobility Specialist Start Time (ACUTE ONLY) 1445  Mobility Specialist Stop Time (ACUTE ONLY) 1501  Mobility Specialist Time Calculation (min) (ACUTE ONLY) 16 min   Pt was found in bed requesting assistance to bathroom. Attempt to have BM but unsuccessful. Agreeable to mobilize afterwards. Grew fatigued with ambulation and was a little unsteady. At EOS returned to sit EOB with all needs met. Call bell in reach and family at bedside.   Erminio Leos,  Mobility Specialist Can be reached via Secure Chat

## 2024-06-01 ENCOUNTER — Inpatient Hospital Stay (HOSPITAL_COMMUNITY)

## 2024-06-01 DIAGNOSIS — R935 Abnormal findings on diagnostic imaging of other abdominal regions, including retroperitoneum: Secondary | ICD-10-CM | POA: Diagnosis not present

## 2024-06-01 DIAGNOSIS — Z720 Tobacco use: Secondary | ICD-10-CM | POA: Diagnosis not present

## 2024-06-01 DIAGNOSIS — J90 Pleural effusion, not elsewhere classified: Secondary | ICD-10-CM | POA: Diagnosis not present

## 2024-06-01 LAB — ACID FAST SMEAR (AFB, MYCOBACTERIA)
Acid Fast Smear: NEGATIVE
Source (AFB): 183518

## 2024-06-01 LAB — URINALYSIS, W/ REFLEX TO CULTURE (INFECTION SUSPECTED)
Bacteria, UA: NONE SEEN
Bilirubin Urine: NEGATIVE
Glucose, UA: NEGATIVE mg/dL
Ketones, ur: NEGATIVE mg/dL
Leukocytes,Ua: NEGATIVE
Nitrite: NEGATIVE
Protein, ur: NEGATIVE mg/dL
Specific Gravity, Urine: 1.006 (ref 1.005–1.030)
pH: 6 (ref 5.0–8.0)

## 2024-06-01 LAB — CA 125: Cancer Antigen (CA) 125: 3663 U/mL — ABNORMAL HIGH (ref 0.0–38.1)

## 2024-06-01 LAB — CEA: CEA: <0.6 ng/mL (ref 0.0–4.7)

## 2024-06-01 MED ORDER — FLEET ENEMA RE ENEM: 1.0000 | ENEMA | Freq: Every day | RECTAL | Status: DC | PRN

## 2024-06-01 MED ORDER — BISACODYL 10 MG RE SUPP
10.0000 mg | Freq: Once | RECTAL | Status: AC
  Administered 2024-06-01: 10 mg via RECTAL
  Filled 2024-06-01: qty 1

## 2024-06-01 NOTE — Progress Notes (Signed)
 "  NAME:  Jodi Cameron, MRN:  [id number], DOB:  09/15/1955, LOS: 4 ADMISSION DATE:  05/28/2024, C History of Present Illness:  Ms. Daudelin is a 69 yo with significant PMHX of smoking 0.5-1ppd and she has quitted 3 weeks ago.  She started having 4 months ago some GI issues, and since 4 days ago, she start experiencing severe shortness of breath with exertion. She went to the PCP and she noticed tachypneic, tachycardic and she was sent to the ER for evaluation. She had some weight loss related to her GI issues, but her weight is back to normal.  She has not f/u with pcp over the last 1.5 years. She has not had hx of COPD or following with pulmonary.   CT scan showed a large pleural effusion with complete collapse of the right lung, with rightward shift. Enlarged LL neck LN  with rightward shift, and severe narrowing of the trachea, concerning for metastatic disease. Extensive LAD involving mediastinum, retrocural region RP. Pulmonary was called for drainage of pleural fluid and possible biopsies. Pertinent  Medical History  Tobacco use   Significant Hospital Events: Including procedures, antibiotic start and stop dates in addition to other pertinent events   Chest tube with suction no draining much.  Interim History / Subjective:  Patient is feeling better. Chest tube in place.   Objective    Blood pressure 117/84, pulse (!) 106, temperature 97.9 F (36.6 C), temperature source Oral, resp. rate 15, height 5' 6 (1.676 m), weight 63.5 kg, SpO2 93%.        Intake/Output Summary (Last 24 hours) at 06/01/2024 1644 Last data filed at 06/01/2024 1219 Gross per 24 hour  Intake 370 ml  Output 440 ml  Net -70 ml   Filed Weights   05/28/24 1626  Weight: 63.5 kg    Examination: Physical Exam Constitutional:      Appearance: She is well-developed.  HENT:     Head: Normocephalic.  Cardiovascular:     Rate and Rhythm: Normal rate and regular rhythm.  Pulmonary:     Effort: Pulmonary effort is  normal.     Comments: Breath sounds improved in the R lung, normal on the left. Musculoskeletal:        General: Normal range of motion.  Neurological:     General: No focal deficit present.     Mental Status: She is alert and oriented to person, place, and time.    CT WO CON 05/29/2023 - large R pl;eural effusion with complete collapse of the R lung and significant mass effect and rightward shift of the cardiomediastinal structures. Enlarged Left lower neck LN with mass effect rightward shift and severe narrowing of the trachea. Concerning with metastatic disease.   Resolved problem list   Assessment and Plan   Large R pleural effusion with complete collapse of the R lung s/p chest tube Large lower neck LN with mass effect with tracheal narrowing s/p biopsy Smoker 0.5-1ppd (quit 3 weeks ago) Abdominal mass concerning for malignancy   Patient presented with large pleural effusion and LAD concerning for malignancy. Chest tube was placed. Drainage has been slowing down.  IR perform a neck biopsy . Chest tube after transitioning to suctioning the output was 40 cc. I will do a CT scan tomorrow given that her CXR is not completely clear.  Plan: -Chest tube water seal -CT scan tomorrow for evaluate pleural effusion -NS flushing 10 cc to the atrium and 20 cc to patient q 8 hours. -pending  cytology. -IR for LN neck biopsy, pending results -pulmonary will continue to follow up for chest tube management.     Labs   CBC: Recent Labs  Lab 05/28/24 1629 05/29/24 0026 05/29/24 0340  WBC 10.5 10.4 10.2  NEUTROABS 6.4  --   --   HGB 12.6 11.8* 10.9*  HCT 39.2 36.8 34.0*  MCV 83.2 84.2 83.5  PLT 598* 557* 531*    Basic Metabolic Panel: Recent Labs  Lab 05/28/24 1629 05/29/24 0026 05/29/24 0340 05/30/24 0355 05/31/24 1159  NA 132*  --  133* 129* 135  K 4.7  --  4.1 4.4 4.1  CL 96*  --  98 95* 99  CO2 26  --  27 27 30   GLUCOSE 110*  --  135* 103* 124*  BUN 18  --  15 12 15    CREATININE 0.61 0.56 0.61 0.63 0.55  CALCIUM 9.3  --  8.8* 8.9 8.9   GFR: Estimated Creatinine Clearance: 64.8 mL/min (by C-G formula based on SCr of 0.55 mg/dL). Recent Labs  Lab 05/28/24 1629 05/29/24 0026 05/29/24 0340  WBC 10.5 10.4 10.2  LATICACIDVEN 1.3 0.9  --     Liver Function Tests: Recent Labs  Lab 05/28/24 1629 05/29/24 0340 05/30/24 0355  AST 32 27 23  ALT 14 11 8   ALKPHOS 157* 133* 124  BILITOT 0.2 0.2 0.2  PROT 8.8* 7.4 7.3  ALBUMIN 3.1* 2.6* 2.6*   No results for input(s): LIPASE, AMYLASE in the last 168 hours. No results for input(s): AMMONIA in the last 168 hours.  ABG No results found for: PHART, PCO2ART, PO2ART, HCO3, TCO2, ACIDBASEDEF, O2SAT   Coagulation Profile: Recent Labs  Lab 05/28/24 1629  INR 1.1    Cardiac Enzymes: No results for input(s): CKTOTAL, CKMB, CKMBINDEX, TROPONINI in the last 168 hours.  HbA1C: No results found for: HGBA1C  CBG: No results for input(s): GLUCAP in the last 168 hours.  Review of Systems:   As above  Past Medical History:  She,  has no past medical history on file.   Surgical History:   Past Surgical History:  Procedure Laterality Date   AUGMENTATION MAMMAPLASTY Bilateral    Subglandular, pt's second set of implants   BREAST ENHANCEMENT SURGERY     IR US  LIVER BIOPSY  05/31/2024   let rotator cuff surgery  08/2022   PARTIAL HYSTERECTOMY       Social History:   reports that she quit smoking about 40 years ago. Her smoking use included cigarettes. She has never used smokeless tobacco. She reports current alcohol use. She reports that she does not currently use drugs.   Family History:  Her family history includes Alzheimer's disease in her mother; Breast cancer in her half-sister; Colon cancer in her maternal grandmother.   Allergies Allergies[1]   Home Medications  Prior to Admission medications  Medication Sig Start Date End Date Taking? Authorizing  Provider  amoxicillin -clavulanate (AUGMENTIN ) 875-125 MG tablet Take 1 tablet by mouth every 12 (twelve) hours. 05/26/24  Yes [name], MD  azithromycin  (ZITHROMAX  Z-PAK) 250 MG tablet Take two pills today followed by one a day until gone 05/26/24  Yes [name], MD  chlorpheniramine-HYDROcodone  (TUSSIONEX) 10-8 MG/5ML Take 5 mLs by mouth every 12 (twelve) hours as needed for cough. 05/26/24  Yes [name], MD  Cholecalciferol (VITAMIN D3) 1.25 MG (50000 UT) TABS Take by mouth.   Yes [provider]  Multiple Vitamin (MULTIVITAMIN) tablet Take 1 tablet by  mouth daily.   Yes [provider]  Omega-3 Fatty Acids (FISH OIL) 1000 MG CAPS Take by mouth.   Yes [provider]  pyridOXINE (B-6) 50 MG tablet Take 50 mg by mouth daily.   Yes [provider]  Turmeric (QC TUMERIC COMPLEX) 500 MG CAPS Take by mouth.   Yes [provider]  valACYclovir  (VALTREX ) 500 MG tablet Take 1 tablet by mouth twice a day for 3 days. 12/11/22  Yes [name], MD  vitamin E 200 UNIT capsule Take 200 Units by mouth daily.   Yes [provider]  Zinc 100 MG TABS Take by mouth.   Yes [provider]       I personally spent 30 minutes providing care of this patient, not including any separately billable procedures.  Marny Patch, MD [facility] 06/01/2024 4:44 PM                 [1]  Allergies Allergen Reactions   Codeine Itching   "

## 2024-06-01 NOTE — Progress Notes (Signed)
 " [facility] Progress Note  Patient: Jodi Cameron     FMW:[id number]  DOA: 05/28/2024   PCP: Wheeler Harlene CROME, NP       Brief hospital course: 69 y/o F with tobacco abuse presented with dyspnea. She was recently given Augmentin  and Z pak but symptoms did not improve.  RR in 20s and HR in 110-120 range, p ox 94-96%  CT shows:   Large R pleural effusion with complete collapse of R lung, left lower neck lymphadenopathy with mass affect on trachea and lymphadenopathy in mediastinum, retrocrural area and superior left retroperitoneal area.   Noted to have dyspnea and tachypnea but no hypoxia.  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 2. Pelvic, retroperitoneal, and retrocrural nodal metastases. 3. Peritoneal disease, as above, with small volume pelvic ascites. 4. Moderate right pleural effusion with indwelling pleural drain.   1/18 Gyn Onc consulted 1/20 Ca 125 3,663  Admits to intention weight loss this past year. Was able to put up 6 Christmas trees for this past Christmas but felt that there was something wrong with her.   Subjective:  No BM after 2 senna. Having episodes of abdominal pain.   Assessment and Plan: Principal Problem:   Pleural effusion on right,chest and abdominal lymphadenopathy, abdominal mass - almost 5 L removed on day 1 of chest tube - bloody fluid in chest tube  - total nucleated cells 4,863- 62% lymphocytes - HIV neg - started on rocephin  and azithromycin  - hydrocodone  ordered for pain from chest tube - Gyn onc, Dr LeonceGLENWOOD Ada, consulted and recommended to order CEA and CA 125   - general oncology contacted and they recommended to call back if needed after biopsy - pleural fluid> 4,580 cc in 1/17, 250 cc on 1/18, water sealed on 1/19 - pleural fluid culture neg - 1/19> L cervical LN biopsy done  - path pending and  fluid cytology pending - Ca 125 is 3,663 - CEA < 0.6 - CXR still showing some fluid in R lung - stop antibiotics today- has completed 5 days - pulm plans for repeat CT tomorrow  Nicotine abuse - counseling done - quit 3 wks ago - no cravings  Hyponatremia - U sodium < 30, Sosm 285, Uosm 250 - resolved with IVF- dc'd fluids  - follow  PND - Flonase  has helped  Hypoalbuminemia - due to chronic illness  - Albumin 2.6 - cont Ensure BID - she states she is eating well when food brought from home but today only wants small amounts of oatmeal - nutrition consult  Elevated alk phos - follow- may be related to lymphadenopathy  Anemia - Hgb 12.6> 10.9 - iron sat 9, TIBC 190, Ferritin 747, 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 She has been ambulating in the room but is very weak.      Code Status: Full Code Total time on patient care: 35 min DVT prophylaxis:  heparin  injection 5,000 Units Start: 05/31/24 2200    Objective:   Vitals:   05/31/24 1500 05/31/24 1935 06/01/24 0458 06/01/24  1342  BP:  125/79 124/81 117/84  Pulse:  (!) 108 (!) 104 (!) 106  Resp:  14 15   Temp:  98.1 F (36.7 C) 98.5 F (36.9 C) 97.9 F (36.6 C)  TempSrc:  Oral Oral Oral  SpO2: 92% 93% 94% 93%  Weight:      Height:       Filed Weights   05/28/24 1626  Weight: 63.5 kg   Exam: General exam: Appears comfortable  HEENT: oral mucosa moist- left cervical masses noted Respiratory system: crackles in RML field, poor breath sounds in RLL Cardiovascular system: S1 & S2 heard  Gastrointestinal system: Abdomen soft, non-tender, nondistended. Normal bowel sounds  - mass present in lower mid  abdomen Extremities: No cyanosis, clubbing or edema Psychiatry:  Mood & affect appropriate.    CBC: Recent Labs  Lab 05/28/24 1629 05/29/24 0026 05/29/24 0340  WBC 10.5 10.4 10.2  NEUTROABS 6.4  --   --   HGB 12.6 11.8* 10.9*  HCT 39.2 36.8 34.0*  MCV 83.2 84.2 83.5  PLT 598* 557* 531*   Basic Metabolic Panel: Recent Labs  Lab 05/28/24 1629 05/29/24 0026 05/29/24 0340 05/30/24 0355 05/31/24 1159  NA 132*  --  133* 129* 135  K 4.7  --  4.1 4.4 4.1  CL 96*  --  98 95* 99  CO2 26  --  27 27 30   GLUCOSE 110*  --  135* 103* 124*  BUN 18  --  15 12 15   CREATININE 0.61 0.56 0.61 0.63 0.55  CALCIUM 9.3  --  8.8* 8.9 8.9     Scheduled Meds:  feeding supplement  237 mL Oral BID BM   fluticasone   2 spray Each Nare Daily   heparin   5,000 Units Subcutaneous Q8H   senna-docusate  1 tablet Oral QHS   sodium chloride  flush  10 mL Intrapleural Q8H   zolpidem   5 mg Oral QHS    Imaging and lab data personally reviewed   Author: Stela Iwasaki  06/01/2024 4:19 PM  To contact [facility]>   Check the care team in [facility] and look for the attending/consulting [name] provider listed  Log into [email or web address] and use [employer] universal password   Go to> [facility]  and find provider  If you still have difficulty reaching the provider, please page the [facility] (Director on Call) for the Hospitalists listed on amion     "

## 2024-06-02 ENCOUNTER — Inpatient Hospital Stay (HOSPITAL_COMMUNITY)

## 2024-06-02 ENCOUNTER — Encounter (HOSPITAL_COMMUNITY): Payer: Self-pay | Admitting: Family Medicine

## 2024-06-02 DIAGNOSIS — J90 Pleural effusion, not elsewhere classified: Secondary | ICD-10-CM

## 2024-06-02 DIAGNOSIS — C778 Secondary and unspecified malignant neoplasm of lymph nodes of multiple regions: Secondary | ICD-10-CM

## 2024-06-02 DIAGNOSIS — Z720 Tobacco use: Secondary | ICD-10-CM | POA: Diagnosis not present

## 2024-06-02 DIAGNOSIS — R935 Abnormal findings on diagnostic imaging of other abdominal regions, including retroperitoneum: Secondary | ICD-10-CM | POA: Diagnosis not present

## 2024-06-02 DIAGNOSIS — J910 Malignant pleural effusion: Secondary | ICD-10-CM

## 2024-06-02 DIAGNOSIS — C563 Malignant neoplasm of bilateral ovaries: Secondary | ICD-10-CM

## 2024-06-02 DIAGNOSIS — R599 Enlarged lymph nodes, unspecified: Secondary | ICD-10-CM

## 2024-06-02 DIAGNOSIS — N838 Other noninflammatory disorders of ovary, fallopian tube and broad ligament: Secondary | ICD-10-CM

## 2024-06-02 DIAGNOSIS — C786 Secondary malignant neoplasm of retroperitoneum and peritoneum: Secondary | ICD-10-CM | POA: Diagnosis not present

## 2024-06-02 LAB — BODY FLUID CULTURE W GRAM STAIN: Culture: NO GROWTH

## 2024-06-02 LAB — COMPREHENSIVE METABOLIC PANEL WITH GFR
ALT: 14 U/L (ref 0–44)
AST: 39 U/L (ref 15–41)
Albumin: 2.4 g/dL — ABNORMAL LOW (ref 3.5–5.0)
Alkaline Phosphatase: 153 U/L — ABNORMAL HIGH (ref 38–126)
Anion gap: 9 (ref 5–15)
BUN: 11 mg/dL (ref 8–23)
CO2: 25 mmol/L (ref 22–32)
Calcium: 8.6 mg/dL — ABNORMAL LOW (ref 8.9–10.3)
Chloride: 96 mmol/L — ABNORMAL LOW (ref 98–111)
Creatinine, Ser: 0.54 mg/dL (ref 0.44–1.00)
GFR, Estimated: >60 mL/min
Glucose, Bld: 98 mg/dL (ref 70–99)
Potassium: 4.4 mmol/L (ref 3.5–5.1)
Sodium: 129 mmol/L — ABNORMAL LOW (ref 135–145)
Total Bilirubin: 0.3 mg/dL (ref 0.0–1.2)
Total Protein: 7.4 g/dL (ref 6.5–8.1)

## 2024-06-02 LAB — SURGICAL PATHOLOGY

## 2024-06-02 LAB — CULTURE, BLOOD (ROUTINE X 2)
Culture: NO GROWTH
Special Requests: ADEQUATE

## 2024-06-02 LAB — CBC
HCT: 32.7 % — ABNORMAL LOW (ref 36.0–46.0)
Hemoglobin: 10.6 g/dL — ABNORMAL LOW (ref 12.0–15.0)
MCH: 26.9 pg (ref 26.0–34.0)
MCHC: 32.4 g/dL (ref 30.0–36.0)
MCV: 83 fL (ref 80.0–100.0)
Platelets: 574 K/uL — ABNORMAL HIGH (ref 150–400)
RBC: 3.94 MIL/uL (ref 3.87–5.11)
RDW: 13.6 % (ref 11.5–15.5)
WBC: 14.4 K/uL — ABNORMAL HIGH (ref 4.0–10.5)
nRBC: 0 % (ref 0.0–0.2)

## 2024-06-02 LAB — CYTOLOGY - NON PAP

## 2024-06-02 MED ORDER — ENSURE PLUS HIGH PROTEIN PO LIQD
237.0000 mL | Freq: Three times a day (TID) | ORAL | Status: DC
  Administered 2024-06-02 – 2024-06-03 (×3): 237 mL via ORAL

## 2024-06-02 MED ORDER — BISACODYL 5 MG PO TBEC
10.0000 mg | DELAYED_RELEASE_TABLET | Freq: Every day | ORAL | Status: DC
  Administered 2024-06-02: 10 mg via ORAL
  Filled 2024-06-02: qty 2

## 2024-06-02 MED ORDER — MIRTAZAPINE 15 MG PO TABS
7.5000 mg | ORAL_TABLET | Freq: Every day | ORAL | Status: DC
  Administered 2024-06-02: 7.5 mg via ORAL
  Filled 2024-06-02: qty 1

## 2024-06-02 MED ORDER — SIMETHICONE 80 MG PO CHEW
80.0000 mg | CHEWABLE_TABLET | Freq: Four times a day (QID) | ORAL | Status: DC | PRN
  Administered 2024-06-02: 80 mg via ORAL
  Filled 2024-06-02: qty 1

## 2024-06-02 MED ORDER — ZOLPIDEM TARTRATE 5 MG PO TABS
5.0000 mg | ORAL_TABLET | Freq: Every evening | ORAL | Status: DC | PRN
  Administered 2024-06-02: 5 mg via ORAL
  Filled 2024-06-02: qty 1

## 2024-06-02 MED ORDER — DOCUSATE SODIUM 100 MG PO CAPS
100.0000 mg | ORAL_CAPSULE | Freq: Two times a day (BID) | ORAL | Status: DC
  Administered 2024-06-02 (×2): 100 mg via ORAL
  Filled 2024-06-02 (×2): qty 1

## 2024-06-02 MED ORDER — POLYETHYLENE GLYCOL 3350 17 G PO PACK
17.0000 g | PACK | Freq: Two times a day (BID) | ORAL | Status: DC
  Administered 2024-06-02 (×2): 17 g via ORAL
  Filled 2024-06-02 (×2): qty 1

## 2024-06-02 NOTE — Progress Notes (Signed)
 " PROGRESS NOTE    Jodi Cameron  FMW:[id number] DOB: 1955-08-03 DOA: 05/28/2024 PCP: Wheeler Harlene CROME, NP   Brief Narrative:68 y/o F with tobacco abuse presented with dyspnea. She was recently given Augmentin  and Z pak but symptoms did not improve.  RR in 20s and HR in 110-120 range, p ox 94-96%  CT  chest shows:   Large R pleural effusion with complete collapse of R lung, left lower neck lymphadenopathy with mass affect on trachea and lymphadenopathy in mediastinum, retrocrural area and superior left retroperitoneal area.   Noted to have dyspnea and tachypnea but no hypoxia.  1/17 Chest tube placed and CT abd/pelvis ordered-   Large, lobulated pelvic mass inseparable from the uterus measuring 14.7 cm,compatible with uterine malignancy.Pelvic, retroperitoneal, and retrocrural nodal metastases.Peritoneal disease, as above, with small volume pelvic ascites. Moderate right pleural effusion with indwelling pleural drain.  1/18 Gyn Onc consulted 1/20 Ca 125 -3,663  CT chest 1/21 . Significant interval decrease in the size of the right pleural effusion status post catheter placement. Small residual right pleural effusion as well as a small amount of air in the pleural space introduced via the catheter.Partial consolidation of the right lower lobe with air bronchogram may represent atelectasis or infiltrate.Trace left pleural effusion.Similar appearance of mediastinal adenopathy.  Assessment & Plan:   Principal Problem:   Pleural effusion on right   Pleural effusion on right,chest and abdominal lymphadenopathy, abdominal mass - almost 5 L removed on day 1 of chest tube - bloody fluid in chest tube  - total nucleated cells 4,863- 62% lymphocytes - HIV neg - started on rocephin  and azithromycin  - hydrocodone  ordered for pain from chest tube - Gyn onc, Dr LeonceGLENWOOD Ada, consulted and recommended to order CEA and CA 125   - general oncology contacted and they recommended to call back if  needed after biopsy - pleural fluid> 4,580 cc in 1/17, 250 cc on 1/18, water sealed on 1/19 - pleural fluid culture neg - 1/19> L cervical LN biopsy done  - path pending and fluid cytology pending - Ca 125 is 3,663 - CEA < 0.6 - CXR still showing some fluid in R lung - stop antibiotics 1/20 has completed 5 days - pulm plans for repeat CT tomorrow   Nicotine abuse - counseling done - quit 3 wks ago - no cravings   Hyponatremia - U sodium < 30, Sosm 285, Uosm 250 - resolved with IVF- dc'd fluids  - follow   PND - Flonase  has helped   Hypoalbuminemia - due to chronic illness  - Albumin 2.6 - cont Ensure BID - she states she is eating well when food brought from home but today only wants small amounts of oatmeal - nutrition consult   Elevated alk phos - follow- may be related to lymphadenopathy   Anemia - Hgb 12.6> 10.9 - iron sat 9, TIBC 190, Ferritin 747, MCV normal - likely AOCD  Thrombocytosis - follow   Insomnia - Ambien  5 mg is helping   Constipation - still constipated Will start miralax  bid dulcolax and senna    Obtain PT eval now that dyspnea improved. She has been ambulating in the room but is very weak.     Estimated body mass index is 22.6 kg/m as calculated from the following:   Height as of this encounter: 5' 6 (1.676 m).   Weight as of this encounter: 63.5 kg.  DVT prophylaxis: heparin  Code Status:full Family Communication: none Disposition Plan:  Status is: Inpatient  Consultants: onc pccm gyn onc IR   Procedures: right chest tube and thora Antimicrobials: none  Subjective: CT removed 1/21 Pathology pending  Still constipated no bm  Appetite low Agreed to start remeron  for appetite stimulation   Objective: Vitals:   06/01/24 1342 06/01/24 1943 06/02/24 0547 06/02/24 1405  BP: 117/84 123/82 126/76 123/70  Pulse: (!) 106 (!) 111 (!) 117 97  Resp:  15 16 17   Temp: 97.9 F (36.6 C) 98.8 F (37.1 C) 99.2 F (37.3 C) 98.1 F  (36.7 C)  TempSrc: Oral Oral Oral Oral  SpO2: 93% 93% 93% 94%  Weight:      Height:        Intake/Output Summary (Last 24 hours) at 06/02/2024 1444 Last data filed at 06/01/2024 2200 Gross per 24 hour  Intake 250 ml  Output --  Net 250 ml   Filed Weights   05/28/24 1626  Weight: 63.5 kg    Examination:  General exam: Appears in nad Respiratory system: Clear to auscultation. Respiratory effort normal. Cardiovascular system: S1 & S2 heard, RRR. No JVD, murmurs, rubs, gallops or clicks. No pedal edema. Gastrointestinal system:soft nt  Central nervous system: Alert and oriented. No focal neurological deficits. Extremities: no edema   Data Reviewed: I have personally reviewed following labs and imaging studies  CBC: Recent Labs  Lab 05/28/24 1629 05/29/24 0026 05/29/24 0340 06/02/24 0345  WBC 10.5 10.4 10.2 14.4*  NEUTROABS 6.4  --   --   --   HGB 12.6 11.8* 10.9* 10.6*  HCT 39.2 36.8 34.0* 32.7*  MCV 83.2 84.2 83.5 83.0  PLT 598* 557* 531* 574*   Basic Metabolic Panel: Recent Labs  Lab 05/28/24 1629 05/29/24 0026 05/29/24 0340 05/30/24 0355 05/31/24 1159 06/02/24 0345  NA 132*  --  133* 129* 135 129*  K 4.7  --  4.1 4.4 4.1 4.4  CL 96*  --  98 95* 99 96*  CO2 26  --  27 27 30 25   GLUCOSE 110*  --  135* 103* 124* 98  BUN 18  --  15 12 15 11   CREATININE 0.61 0.56 0.61 0.63 0.55 0.54  CALCIUM 9.3  --  8.8* 8.9 8.9 8.6*   GFR: Estimated Creatinine Clearance: 64.8 mL/min (by C-G formula based on SCr of 0.54 mg/dL). Liver Function Tests: Recent Labs  Lab 05/28/24 1629 05/29/24 0340 05/30/24 0355 06/02/24 0345  AST 32 27 23 39  ALT 14 11 8 14   ALKPHOS 157* 133* 124 153*  BILITOT 0.2 0.2 0.2 0.3  PROT 8.8* 7.4 7.3 7.4  ALBUMIN 3.1* 2.6* 2.6* 2.4*   No results for input(s): LIPASE, AMYLASE in the last 168 hours. No results for input(s): AMMONIA in the last 168 hours. Coagulation Profile: Recent Labs  Lab 05/28/24 1629  INR 1.1   Cardiac  Enzymes: No results for input(s): CKTOTAL, CKMB, CKMBINDEX, TROPONINI in the last 168 hours. BNP (last 3 results) No results for input(s): PROBNP in the last 8760 hours. HbA1C: No results for input(s): HGBA1C in the last 72 hours. CBG: No results for input(s): GLUCAP in the last 168 hours. Lipid Profile: No results for input(s): CHOL, HDL, LDLCALC, TRIG, CHOLHDL, LDLDIRECT in the last 72 hours. Thyroid Function Tests: No results for input(s): TSH, T4TOTAL, FREET4, T3FREE, THYROIDAB in the last 72 hours. Anemia Panel: No results for input(s): VITAMINB12, FOLATE, FERRITIN, TIBC, IRON, RETICCTPCT in the last 72 hours. Sepsis Labs: Recent Labs  Lab 05/28/24 1629 05/29/24 0026  LATICACIDVEN 1.3  0.9    Recent Results (from the past 240 hours)  Culture, blood (Routine x 2)     Status: None   Collection Time: 05/28/24  4:35 PM   Specimen: BLOOD RIGHT FOREARM  Result Value Ref Range Status   Specimen Description   Final    BLOOD RIGHT FOREARM Performed at [facility], [address], [place], [place] [address]    Special Requests   Final    BOTTLES DRAWN AEROBIC AND ANAEROBIC Blood Culture adequate volume Performed at [facility], [address] [place], [place] [address]    Culture   Final    NO GROWTH 5 DAYS Performed at [facility], [address], [place], [place] [address]    Report Status 06/02/2024 FINAL  Final  Resp panel by RT-PCR (RSV, Flu A&B, Covid) Anterior Nasal Swab     Status: None   Collection Time: 05/28/24  5:08 PM   Specimen: Anterior Nasal Swab  Result Value Ref Range Status   SARS Coronavirus 2 by RT PCR NEGATIVE NEGATIVE Final    Comment: (NOTE) SARS-CoV-2 target nucleic acids are NOT DETECTED.  The SARS-CoV-2 RNA is generally detectable in upper respiratory specimens during the acute phase of infection. The lowest concentration of SARS-CoV-2 viral copies this assay can detect  is 138 copies/mL. A negative result does not preclude SARS-Cov-2 infection and should not be used as the sole basis for treatment or other patient management decisions. A negative result may occur with  improper specimen collection/handling, submission of specimen other than nasopharyngeal swab, presence of viral mutation(s) within the areas targeted by this assay, and inadequate number of viral copies(<138 copies/mL). A negative result must be combined with clinical observations, patient history, and epidemiological information. The expected result is Negative.  Fact Sheet for Patients:  bloggercourse.com  Fact Sheet for Healthcare Providers:  seriousbroker.it  This test is no t yet approved or cleared by the [place]  FDA and  has been authorized for detection and/or diagnosis of SARS-CoV-2 by FDA under an Emergency Use Authorization (EUA). This EUA will remain  in effect (meaning this test can be used) for the duration of the COVID-19 declaration under Section 564(b)(1) of the Act, 21 U.S.C.section 360bbb-3(b)(1), unless the authorization is terminated  or revoked sooner.       Influenza A by PCR NEGATIVE NEGATIVE Final   Influenza B by PCR NEGATIVE NEGATIVE Final    Comment: (NOTE) The Xpert Xpress SARS-CoV-2/FLU/RSV plus assay is intended as an aid in the diagnosis of influenza from Nasopharyngeal swab specimens and should not be used as a sole basis for treatment. Nasal washings and aspirates are unacceptable for Xpert Xpress SARS-CoV-2/FLU/RSV testing.  Fact Sheet for Patients: bloggercourse.com  Fact Sheet for Healthcare Providers: seriousbroker.it  This test is not yet approved or cleared by the [place]  FDA and has been authorized for detection and/or diagnosis of SARS-CoV-2 by FDA under an Emergency Use Authorization (EUA). This EUA will remain in effect  (meaning this test can be used) for the duration of the COVID-19 declaration under Section 564(b)(1) of the Act, 21 U.S.C. section 360bbb-3(b)(1), unless the authorization is terminated or revoked.     Resp Syncytial Virus by PCR NEGATIVE NEGATIVE Final    Comment: (NOTE) Fact Sheet for Patients: bloggercourse.com  Fact Sheet for Healthcare Providers: seriousbroker.it  This test is not yet approved or cleared by the [place]  FDA and has been authorized for detection and/or diagnosis of  SARS-CoV-2 by FDA under an Emergency Use Authorization (EUA). This EUA will remain in effect (meaning this test can be used) for the duration of the COVID-19 declaration under Section 564(b)(1) of the Act, 21 U.S.C. section 360bbb-3(b)(1), unless the authorization is terminated or revoked.  Performed at [facility], [address] [place], [place] [address]   Culture, blood (Routine x 2)     Status: None (Preliminary result)   Collection Time: 05/29/24 12:26 AM   Specimen: BLOOD LEFT ARM  Result Value Ref Range Status   Specimen Description BLOOD LEFT ARM  Final   Special Requests   Final    BOTTLES DRAWN AEROBIC ONLY Blood Culture adequate volume   Culture   Final    NO GROWTH 4 DAYS Performed at [facility], [address] [place], [place] [address]    Report Status PENDING  Incomplete  Body fluid culture w Gram Stain     Status: None   Collection Time: 05/29/24 11:13 AM   Specimen: Pleura; Body Fluid  Result Value Ref Range Status   Specimen Description   Final    PLEURAL Performed at [facility], [address], [place], [place] [address]    Special Requests   Final    NONE Performed at [facility], [address] [place], [place] [address]    Gram Stain   Final    RARE WBC PRESENT, PREDOMINANTLY MONONUCLEAR NO ORGANISMS SEEN    Culture   Final    NO GROWTH 3  DAYS Performed at [facility], [address] [place], [place] [address]    Report Status 06/02/2024 FINAL  Final  Culture, fungus without smear     Status: None (Preliminary result)   Collection Time: 05/29/24 11:13 AM   Specimen: Pleura; Other  Result Value Ref Range Status   Specimen Description   Final    PLEURAL Performed at [facility], [address], [place], [place] [address]    Special Requests   Final    NONE Performed at [facility], [address], [place], [place] [address]    Culture   Final    NO GROWTH 4 DAYS Performed at [facility], [address], [place], [place] [address]    Report Status PENDING  Incomplete  Acid Fast Smear (AFB)     Status: None   Collection Time: 05/29/24 11:13 AM   Specimen: Pleural  Result Value Ref Range Status   AFB Specimen Processing Concentration  Final   Acid Fast Smear Negative  Final    Comment: (NOTE) Performed At: [facility] [address] [place], [place] [id number] Jennette Shorter MD Ey:[id number]    Source (AFB) [phone]  Final    Comment: Performed at [facility], [address], [place], [place] [address]         Radiology Studies: CT CHEST WO CONTRAST Result Date: 06/02/2024 CLINICAL DATA:  Evaluate for pleural effusion. EXAM: CT CHEST WITHOUT CONTRAST TECHNIQUE: Multidetector CT imaging of the chest was performed following the standard protocol without IV contrast. RADIATION DOSE REDUCTION: This exam was performed according to the departmental dose-optimization program which includes automated exposure control, adjustment of the mA and/or kV according to patient size and/or use of iterative reconstruction technique. COMPARISON:  Chest CT dated 05/28/2024. FINDINGS: Evaluation of this exam is limited in the absence of intravenous contrast. Cardiovascular: There is no cardiomegaly. Small  pericardial effusion. The thoracic aorta and the  central pulmonary arteries are grossly unremarkable on this noncontrast CT. Mediastinum/Nodes: Similar appearance of mediastinal adenopathy measuring up to 18 mm short axis in the prevascular space. The esophagus is grossly unremarkable. Partially calcified mass in the left lower neck measures 4.5 x 3.0 cm, likely adenopathy. Lungs/Pleura: Significant interval decrease in the size of the right pleural effusion status post catheter placement. The tip of the catheter is in the medial right lung base pleural surface. A small residual right pleural effusion as well as a small amount of air in the pleural space introduced via the catheter. There is partial consolidation of the right lower lobe with air bronchogram which may represent atelectasis or infiltrate. Trace left pleural effusion. The central airways are patent. Upper Abdomen: Retroperitoneal and retrocrural adenopathy as well as scattered omental masses consistent with implant. Musculoskeletal: Osteopenia with degenerative changes. No acute osseous pathology. Bilateral breast implants noted. IMPRESSION: 1. Significant interval decrease in the size of the right pleural effusion status post catheter placement. Small residual right pleural effusion as well as a small amount of air in the pleural space introduced via the catheter. 2. Partial consolidation of the right lower lobe with air bronchogram may represent atelectasis or infiltrate. 3. Trace left pleural effusion. 4. Similar appearance of mediastinal adenopathy. 5. Retroperitoneal and retrocrural adenopathy as well as scattered omental masses consistent with implant. Electronically Signed   By: Vanetta Chou M.D.   On: 06/02/2024 12:30   DG CHEST PORT 1 VIEW Result Date: 06/01/2024 EXAM: 1 VIEW XRAY OF THE CHEST 06/01/2024 12:42:00 PM COMPARISON: 05/31/2024 CLINICAL HISTORY: Chest tube in place. FINDINGS: LINES, TUBES AND DEVICES: Right basilar chest tube in place. LUNGS AND PLEURA: Unchanged small  right pleural effusion. Hazy opacity at right lung base, likely representing atelectasis. HEART AND MEDIASTINUM: No acute abnormality of the cardiac and mediastinal silhouettes. BONES AND SOFT TISSUES: No acute osseous abnormality. IMPRESSION: 1. Right basilar chest tube present with unchanged small right pleural effusion. 2. Hazy right basilar opacity, compatible with atelectasis. Electronically signed by: Dayne Hassell MD 06/01/2024 05:02 PM EST RP Workstation: [id number]        Scheduled Meds:  bisacodyl   10 mg Oral Daily   docusate sodium   100 mg Oral BID   feeding supplement  237 mL Oral TID BM   fluticasone   2 spray Each Nare Daily   heparin   5,000 Units Subcutaneous Q8H   polyethylene glycol  17 g Oral BID   senna-docusate  1 tablet Oral QHS   sodium chloride  flush  10 mL Intrapleural Q8H   zolpidem   5 mg Oral QHS   Continuous Infusions:   LOS: 5 days    Almarie KANDICE Hoots, MD  06/02/2024, 2:44 PM   "

## 2024-06-02 NOTE — Progress Notes (Signed)
 DRAFTING NOTE, PATIENT HAS NOT BEEN SEEN YET  Gynecologic Oncology Progress Note  Initial Reason for Consult: Pelvic mass/pleural effusion Referring Physician: True Atlas, MD Date: 06/02/2024   From initial consult with Dr. Rogelio: Jodi Cameron is a 69 y.o. female who initially presented to ED on 05/28/2024 w/ shortness of breath and cough.  She endorsed general malaise, anorexia, fatigue, early satiety, difficulty swallowing and nasal congestion x 1 mth.  No vomiting, but she feels full quickly after eating, experiences frequent urination, and bloating. She also reports constipation, which she attempted to address with a cleanse after returning from a trip in August 2025. She has lost 32 pounds intentionally since June 2024.She was recently treated w/an outpt regimen for pneumonia.  She later presented to her PCP and was found to be tachypneic and tachycardic.     She has experienced sharp stomach cramps for at least two years, often waking her at night. Initially sporadic, occurring once a month or less, the cramps have worsened over time. She sought medical attention two years ago, but no diagnosis was made at that time.   She has a history of smoking for 30 years, with intermittent cessation, most recently quitting for nine months before resuming after her trip.   She underwent a hysterectomy due to fibroids after the birth of her son, Zach, who is now 14 years old. She feels her cervix was preserved. Her ovaries were left conserved. No vaginal bleeding. She reports a sensation of 'feeling hollow' over the past two weeks, describing it as feeling like something is dropping inside her, although there is no actual dropping sensation.   Mammogram in 11/2022 was benign. There is a family h/o breast cancer.    During this admission, a exam was remarkable for left-sided cervical lymphadenopathy w/secondary tracheal narrowing. She is s/p chest tube placement for a large right pleural effusion  causing a collapsed lung.   Interval: Earlier today, she had the chest tube removed. Feels relieved to have this out. Having intermittent nausea. Bowels moved today. Has been having issues with her bowels.   History reviewed. No pertinent past medical history.            Past Surgical History:  Procedure Laterality Date   AUGMENTATION MAMMAPLASTY Bilateral      Subglandular, pt's second set of implants   BREAST ENHANCEMENT SURGERY       let rotator cuff surgery   08/2022   PARTIAL HYSTERECTOMY                   Family History  Problem Relation Age of Onset   Alzheimer's disease Mother     Colon cancer Maternal Grandmother     Breast cancer Half-Sister            Social History:  reports that she quit smoking about 40 years ago. Her smoking use included cigarettes. She has never used smokeless tobacco. She reports current alcohol use. She reports that she does not currently use drugs.   Allergies: [Allergies]  [Allergies]     Allergen Reactions   Codeine Itching     Medications: I have reviewed the patient's current medications.   Recent labs reviewed including pathology results, tumor markers.   Imaging Results (Last 48 hours)  DG CHEST PORT 1 VIEW Result Date: 05/30/2024 EXAM: 1 VIEW(S) XRAY OF THE CHEST 05/30/2024 05:30:00 AM COMPARISON: Portable chest yesterday at 11:44 am. CLINICAL HISTORY: Pleural effusion. FINDINGS: LINES, TUBES AND DEVICES: There is a pigtail  right chest tube with the pigtail in the medial base of the right thorax. LUNGS AND PLEURA: There is moderate right pleural effusion, either improved or redistributed . There is Patchy airspace disease in the right mid lung overlying the effusion, and consolidation or atelectasis in the adjacent right base. The remainder of the lungs are clear. Moderate right pleural effusion remains, either improved or redistributed. No pneumothorax. HEART AND MEDIASTINUM: No acute abnormality of the cardiac and mediastinal  silhouettes. BONES AND SOFT TISSUES: No acute osseous abnormality. IMPRESSION: 1. Moderate right pleural effusion, either improved or redistributed, with a pigtail right chest tube in place. 2. Patchy airspace disease in the right mid lung overlying the effusion, and consolidation or atelectasis in the adjacent right base. Electronically signed by: Francis Quam MD 05/30/2024 06:00 AM EST RP Workstation: [id number]    CT ABDOMEN PELVIS W CONTRAST Result Date: 05/29/2024 EXAM: CT ABDOMEN AND PELVIS WITH CONTRAST 05/29/2024 04:58:02 PM TECHNIQUE: CT of the abdomen and pelvis was performed with the administration of 100 mL of iohexol  (OMNIPAQUE ) 300 MG/ML solution. Multiplanar reformatted images are provided for review. Automated exposure control, iterative reconstruction, and/or weight-based adjustment of the mA/kV was utilized to reduce the radiation dose to as low as reasonably achievable. COMPARISON: Partial comparison CT chest dated 05/28/2024. CLINICAL HISTORY: Metastatic disease evaluation. FINDINGS: LOWER CHEST: Moderate right pleural effusion with indwelling pleural drain. Right middle lobe and right lower lobe atelectasis/collapse. Retrocrural nodal metastases measuring up to 3.0 cm on the right (image 14). LIVER: Subcentimeter posterior right hepatic cyst, benign. GALLBLADDER AND BILE DUCTS: Gallbladder is unremarkable. No biliary ductal dilatation. SPLEEN: No acute abnormality. PANCREAS: No acute abnormality. ADRENAL GLANDS: No acute abnormality. KIDNEYS, URETERS AND BLADDER: No stones in the kidneys or ureters. No hydronephrosis. No perinephric or periureteral stranding. Urinary bladder is unremarkable. GI AND BOWEL: Stomach demonstrates no acute abnormality. Additional peritoneal disease in the left upper abdomen adjacent to the stomach and spleen (image 20). There is no bowel obstruction. PERITONEUM AND RETROPERITONEUM: Small volume pelvic ascites. No free air. 8.4 x 4.7 cm mass beneath the left lower  anterior abdominal wall (image 61), and additional 7.0 x 4.5 cm mass beneath the midline anterior abdominal wall (image 50), suspicious for peritoneal disease. VASCULATURE: Aorta is normal in caliber. LYMPH NODES: Pelvic/retroperitoneal nodal metastases, including a dominant 3.3 cm left periaortic node (image 34). REPRODUCTIVE ORGANS: Large, lobulated pelvic mass inseparable from the uterus measuring 14.7 x 12.6 cm (image 68), compatible with uterine malignancy. BONES AND SOFT TISSUES: No acute osseous abnormality. Bilateral breast augmentation. IMPRESSION: 1. Large, lobulated pelvic mass inseparable from the uterus measuring 14.7 cm, compatible with uterine malignancy. 2. Pelvic, retroperitoneal, and retrocrural nodal metastases. 3. Peritoneal disease, as above, with small volume pelvic ascites. 4. Moderate right pleural effusion with indwelling pleural drain. Electronically signed by: Pinkie Pebbles MD 05/29/2024 08:12 PM EST RP Workstation: [id number]    DG CHEST PORT 1 VIEW Result Date: 05/29/2024 EXAM: 1 VIEW(S) XRAY OF THE CHEST 05/29/2024 11:47:00 AM COMPARISON: 05/28/2024 CLINICAL HISTORY: Encounter for chest tube placement FINDINGS: LINES, TUBES AND DEVICES: Right chest tube with pigtail overlying the medial right lower hemithorax. LUNGS AND PLEURA: Large right pleural effusion, decreased compared to prior exam. No focal pulmonary opacity. No pneumothorax. HEART AND MEDIASTINUM: No acute abnormality of the cardiac and mediastinal silhouettes. BONES AND SOFT TISSUES: Cystic changes in the right glenoid, likely degenerative. IMPRESSION: 1. Right chest tube with pigtail overlying the medial right lower hemithorax. 2. Large right pleural effusion, decreased.  Electronically signed by: Waddell Calk MD 05/29/2024 12:00 PM EST RP Workstation: [id number]       06/02/2024    5:47 AM 06/01/2024    7:43 PM 06/01/2024    1:42 PM  Vitals with BMI  Systolic 126 123 882  Diastolic 76 82 84  Pulse 117 111 106     Physical Exam Constitutional:      Comments: cachetic   Chest: Lungs clear with breathing unlabored. Mildly tachycardic Abdominal:     Comments: Firm (more in lower quadrants from mass effect), mild distension, slightly tender on palpation, active bowel sounds. Musculoskeletal:     Right lower leg: No edema.     Left lower leg: No edema.  Skin:    General: Skin is warm and dry.  Neurological:     General: No focal deficit present.     Mental Status: She is alert, oriented, in no acute distress, resting in bed.  Psychiatric:        Mood and Affect: Mood normal.   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 Cytology from pleural fluid returning with metastatic carcinoma. 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 Tumor markers obtained including CEA <0.6 and CA 125 at 3,663. Patient informed of pathology results by Dr. Viktoria. Given extent of disease, surgery would not be advised and would be sub-optimal at this point. Systemic chemotherapy treatment discussed as recommendation including carboplatin, taxol, with possible addition of Avastin. See addendum to note from Dr. Viktoria for additional details to conversation with patient.

## 2024-06-02 NOTE — Progress Notes (Signed)
 Initial Nutrition Assessment  DOCUMENTATION CODES:   Severe malnutrition in context of acute illness/injury  INTERVENTION:  - Regular diet.  - Ensure Plus High Protein po BID, each supplement provides 350 kcal and 20 grams of protein. - Encourage intake of frequent meals and snacks in addition to supplements.  - High-Calorie High-Protein Nutrition Therapy diet education with handout provided. - Monitor weight trends.   NUTRITION DIAGNOSIS:   Severe Malnutrition related to acute illness as evidenced by severe fat depletion, severe muscle depletion.  GOAL:   Patient will meet greater than or equal to 90% of their needs  MONITOR:   PO intake, Supplement acceptance, Weight trends  REASON FOR ASSESSMENT:   Consult Assessment of nutrition requirement/status  ASSESSMENT:    69 y/o F with PMH tobacco abuse who presented with dyspnea. Admitted for pleural effusion and also found to have abdominal mass.  Patient reports a UBW of 140# and that she last weight starting around 6-8 weeks ago but over the past few weeks has been able to gain some back.  Per EMR, no weight history PTA to assess recent changes.   Patient endorses typically eating 2 meals a day, lunch and dinner, at home. Shares she was eating fairly well PTA. She reports taking several vitamins at home. Per home meds list patient taking a multivitamin, vitamin D, vitamin B6, zinc, vitamin E, and fish oil.  Notes that since admission she has had a poor appetite. Patient noted to initially be eating well with 100% of meals through 1/17-1/18 but since 1/19 eating 0% of meals.  Thankfully, has been doing better with meals brought in by family. She also notes she is doing better with liquids than solids and has been drinking Ensure twice daily.   Discussed increased nutrient needs and importance of trying to consume something at all 3 meals. RD provided High-Calorie High-Protein Nutrition Therapy handout from the [employer]  [employer]. Provided examples on ways to increase caloric density of foods and beverages. Also provided ideas to promote variety and to incorporate additional nutrient dense foods into patient's diet. Discussed eating small frequent meals and snacks to assist in increasing overall po intake. Teach back method used.  Patient appreciative of information. She is agreeable to increase Ensure to TID to further support intake as well.    Medications reviewed and include: Dulcolax, Colace, Miralax , Senokot  Labs reviewed:  Na 129   NUTRITION - FOCUSED PHYSICAL EXAM:  Flowsheet Row Most Recent Value  Orbital Region Severe depletion  Upper Arm Region Moderate depletion  Thoracic and Lumbar Region Moderate depletion  Buccal Region Severe depletion  Temple Region Severe depletion  Clavicle Bone Region Severe depletion  Clavicle and Acromion Bone Region Severe depletion  Scapular Bone Region Unable to assess  Dorsal Hand Moderate depletion  Patellar Region Mild depletion  Anterior Thigh Region Mild depletion  Posterior Calf Region No depletion  Edema (RD Assessment) None  Hair Reviewed  Eyes Reviewed  Mouth Reviewed  Skin Reviewed  Nails Reviewed    Diet Order:   Diet Order             Diet regular Room service appropriate? Yes; Fluid consistency: Thin  Diet effective now                   EDUCATION NEEDS:  Education needs have been addressed  Skin:  Skin Assessment: Reviewed RN Assessment  Last BM:  1/20  Height:  Ht Readings from Last 1 Encounters:  05/28/24 5' 6 (1.676 m)   Weight:  Wt Readings from Last 1 Encounters:  05/28/24 63.5 kg    BMI:  Body mass index is 22.6 kg/m.  Estimated Nutritional Needs:  Kcal:  1900-2100 kcals Protein:  90-100 grams Fluid:  >/= 1.9L    Trude Ned RD, LDN Contact via Secure Chat.

## 2024-06-02 NOTE — Progress Notes (Signed)
 "  NAME:  Jodi Cameron, MRN:  [id number], DOB:  17-Jul-1955, LOS: 5 ADMISSION DATE:  05/28/2024, C History of Present Illness:  Jodi Cameron is a 69 yo with significant PMHX of smoking 0.5-1ppd and she has quitted 3 weeks ago.  She started having 4 months ago some GI issues, and since 4 days ago, she start experiencing severe shortness of breath with exertion. She went to the PCP and she noticed tachypneic, tachycardic and she was sent to the ER for evaluation. She had some weight loss related to her GI issues, but her weight is back to normal.  She has not f/u with pcp over the last 1.5 years. She has not had hx of COPD or following with pulmonary.   CT scan showed a large pleural effusion with complete collapse of the right lung, with rightward shift. Enlarged LL neck LN  with rightward shift, and severe narrowing of the trachea, concerning for metastatic disease. Extensive LAD involving mediastinum, retrocural region RP. Pulmonary was called for drainage of pleural fluid and possible biopsies. Pertinent  Medical History  Tobacco use   Significant Hospital Events: Including procedures, antibiotic start and stop dates in addition to other pertinent events   Chest tube with suction no draining much.  Interim History / Subjective:  Patient is feeling better. Chest tube in place.   Objective    Blood pressure 123/70, pulse 97, temperature 98.1 F (36.7 C), temperature source Oral, resp. rate 17, height 5' 6 (1.676 m), weight 63.5 kg, SpO2 94%.        Intake/Output Summary (Last 24 hours) at 06/02/2024 1807 Last data filed at 06/01/2024 2200 Gross per 24 hour  Intake 250 ml  Output --  Net 250 ml   Filed Weights   05/28/24 1626  Weight: 63.5 kg    Examination: Physical Exam Constitutional:      Appearance: She is well-developed.  HENT:     Head: Normocephalic.  Cardiovascular:     Rate and Rhythm: Normal rate and regular rhythm.  Pulmonary:     Effort: Pulmonary effort is normal.      Comments: Breath sounds improved in the R lung, normal on the left. Musculoskeletal:        General: Normal range of motion.  Neurological:     General: No focal deficit present.     Mental Status: She is alert and oriented to person, place, and time.    CT WO CON 05/29/2023 - large R pl;eural effusion with complete collapse of the R lung and significant mass effect and rightward shift of the cardiomediastinal structures. Enlarged Left lower neck LN with mass effect rightward shift and severe narrowing of the trachea. Concerning with metastatic disease.   Resolved problem list   Assessment and Plan   Large R pleural effusion with complete collapse of the R lung s/p chest tube Large lower neck LN with mass effect with tracheal narrowing s/p biopsy Smoker 0.5-1ppd (quit 3 weeks ago) Abdominal mass concerning for malignancy   Patient presented with large pleural effusion and LAD concerning for malignancy. Chest tube was placed. Drainage has been slowing down.  IR perform a neck biopsy . No chest tube output. CT scan with resolution of the pleural effusion, and very mild air, no pneumothorax. No air leak on atrium, attempt suction with no air leak. I removed the chest tube today. Plan: -I removed chest tube. Dressing to be continue for 48 hours. -If pleural effusion continue to re accumulate requiring multiple thoras,  consideration for pleurx in the future. -I sign off.      Labs   CBC: Recent Labs  Lab 05/28/24 1629 05/29/24 0026 05/29/24 0340 06/02/24 0345  WBC 10.5 10.4 10.2 14.4*  NEUTROABS 6.4  --   --   --   HGB 12.6 11.8* 10.9* 10.6*  HCT 39.2 36.8 34.0* 32.7*  MCV 83.2 84.2 83.5 83.0  PLT 598* 557* 531* 574*    Basic Metabolic Panel: Recent Labs  Lab 05/28/24 1629 05/29/24 0026 05/29/24 0340 05/30/24 0355 05/31/24 1159 06/02/24 0345  NA 132*  --  133* 129* 135 129*  K 4.7  --  4.1 4.4 4.1 4.4  CL 96*  --  98 95* 99 96*  CO2 26  --  27 27 30 25   GLUCOSE  110*  --  135* 103* 124* 98  BUN 18  --  15 12 15 11   CREATININE 0.61 0.56 0.61 0.63 0.55 0.54  CALCIUM 9.3  --  8.8* 8.9 8.9 8.6*   GFR: Estimated Creatinine Clearance: 64.8 mL/min (by C-G formula based on SCr of 0.54 mg/dL). Recent Labs  Lab 05/28/24 1629 05/29/24 0026 05/29/24 0340 06/02/24 0345  WBC 10.5 10.4 10.2 14.4*  LATICACIDVEN 1.3 0.9  --   --     Liver Function Tests: Recent Labs  Lab 05/28/24 1629 05/29/24 0340 05/30/24 0355 06/02/24 0345  AST 32 27 23 39  ALT 14 11 8 14   ALKPHOS 157* 133* 124 153*  BILITOT 0.2 0.2 0.2 0.3  PROT 8.8* 7.4 7.3 7.4  ALBUMIN 3.1* 2.6* 2.6* 2.4*   No results for input(s): LIPASE, AMYLASE in the last 168 hours. No results for input(s): AMMONIA in the last 168 hours.  ABG No results found for: PHART, PCO2ART, PO2ART, HCO3, TCO2, ACIDBASEDEF, O2SAT   Coagulation Profile: Recent Labs  Lab 05/28/24 1629  INR 1.1    Cardiac Enzymes: No results for input(s): CKTOTAL, CKMB, CKMBINDEX, TROPONINI in the last 168 hours.  HbA1C: No results found for: HGBA1C  CBG: No results for input(s): GLUCAP in the last 168 hours.  Review of Systems:   As above  Past Medical History:  She,  has no past medical history on file.   Surgical History:   Past Surgical History:  Procedure Laterality Date   AUGMENTATION MAMMAPLASTY Bilateral    Subglandular, pt's second set of implants   BREAST ENHANCEMENT SURGERY     IR US  LIVER BIOPSY  05/31/2024   let rotator cuff surgery  08/2022   PARTIAL HYSTERECTOMY       Social History:   reports that she quit smoking about 40 years ago. Her smoking use included cigarettes. She has never used smokeless tobacco. She reports current alcohol use. She reports that she does not currently use drugs.   Family History:  Her family history includes Alzheimer's disease in her mother; Breast cancer in her half-sister; Colon cancer in her maternal grandmother.    Allergies Allergies[1]   Home Medications  Prior to Admission medications  Medication Sig Start Date End Date Taking? Authorizing Provider  amoxicillin -clavulanate (AUGMENTIN ) 875-125 MG tablet Take 1 tablet by mouth every 12 (twelve) hours. 05/26/24  Yes [name], MD  azithromycin  (ZITHROMAX  Z-PAK) 250 MG tablet Take two pills today followed by one a day until gone 05/26/24  Yes [name], MD  chlorpheniramine-HYDROcodone  (TUSSIONEX) 10-8 MG/5ML Take 5 mLs by mouth every 12 (twelve) hours as needed for cough. 05/26/24  Yes [name], MD  Cholecalciferol (VITAMIN D3) 1.25 MG (50000 UT) TABS Take by mouth.   Yes [provider]  Multiple Vitamin (MULTIVITAMIN) tablet Take 1 tablet by mouth daily.   Yes [provider]  Omega-3 Fatty Acids (FISH OIL) 1000 MG CAPS Take by mouth.   Yes [provider]  pyridOXINE (B-6) 50 MG tablet Take 50 mg by mouth daily.   Yes [provider]  Turmeric (QC TUMERIC COMPLEX) 500 MG CAPS Take by mouth.   Yes [provider]  valACYclovir  (VALTREX ) 500 MG tablet Take 1 tablet by mouth twice a day for 3 days. 12/11/22  Yes [name], MD  vitamin E 200 UNIT capsule Take 200 Units by mouth daily.   Yes [provider]  Zinc 100 MG TABS Take by mouth.   Yes [provider]       I personally spent 30 minutes providing care of this patient, not including any separately billable procedures.  Marny Patch, MD [facility] 06/02/2024 6:07 PM                  [1]  Allergies Allergen Reactions   Codeine Itching   "

## 2024-06-02 NOTE — Evaluation (Signed)
 Physical Therapy Evaluation Patient Details Name: Jodi Cameron MRN: [id number] DOB: March 01, 1956 Today's Date: 06/02/2024  History of Present Illness  Jodi Cameron is a 69 y.o. female presents with dyspnea. 1/16 chest CT Large right pleural effusion with complete collapse of the right middle and right lower lobes, near complete collapse of the right upper lobe, and significant mass effect and rightward shift of the cardiomediastinal structures. 1/17 Chest tube placed and CT abd/pelvis 1.Large, lobulated pelvic mass inseparable from the uterus measuring 14.7 cm,  compatible with uterine malignancy.  2. Pelvic, retroperitoneal, and retrocrural nodal metastases.  3. Peritoneal disease, as above, with small volume pelvic ascites.  4. Moderate right pleural effusion with indwelling pleural drain.  PMH: tobacco abuse  Clinical Impression  Pt admitted with above diagnosis. PTA, pt reports ind without AD, was an active hiker but has had decreased energy the past ~6 months and has been less active, drives, has pets at home, reports good friend/family support. On eval, pt denies numbness/tingling throughout BLE, is able to reposition to comfort and mobilizing BLE in bed as desired. Pt with multiple questions regarding HHPT, requesting referral to improve strength and return to PLOF; questions answered and education complete. Pt declines mobilizing OOB, reports has ambulated to restroom and in hallway with nursing, recently rode in w/c with friend propelling/steering and is now fatigued; RN confirms pt mobilizing with nursing assistance. Recommend HHPT per pt request; DME TBD pending trial while mobilizing. Pt currently with functional limitations due to the deficits listed below (see PT Problem List). Pt will benefit from acute skilled PT to increase their independence and safety with mobility to allow discharge.           If plan is discharge home, recommend the following:     Can travel by private vehicle         Equipment Recommendations Other (comment) (TBD)  Recommendations for Other Services       Functional Status Assessment Patient has had a recent decline in their functional status and demonstrates the ability to make significant improvements in function in a reasonable and predictable amount of time.     Precautions / Restrictions Precautions Recall of Precautions/Restrictions: Intact Precaution/Restrictions Comments: chest tube Restrictions Weight Bearing Restrictions Per Provider Order: No      Mobility  Bed Mobility               General bed mobility comments: pt freely mobilizing BLE and repositioning in bed, declines mobilizing OOB    Transfers                        Ambulation/Gait                  Stairs            Wheelchair Mobility     Tilt Bed    Modified Rankin (Stroke Patients Only)       Balance                                             Pertinent Vitals/Pain Pain Assessment Pain Assessment: No/denies pain    Home Living Family/patient expects to be discharged to:: Private residence Living Arrangements: Alone Available Help at Discharge: Family Type of Home: House Home Access: Stairs to enter   [employer] of Steps: 2-3   Home Layout: Two  level;Able to live on main level with bedroom/bathroom Home Equipment: None      Prior Function Prior Level of Function : Independent/Modified Independent;Driving             Mobility Comments: pt reports ind, has 1 dog and 2 cats she cares for, was an active hiker until ~6 months ago ADLs Comments: pt reports ind     Extremity/Trunk Assessment   Upper Extremity Assessment Upper Extremity Assessment: Overall WFL for tasks assessed    Lower Extremity Assessment Lower Extremity Assessment: Overall WFL for tasks assessed (denies numbness/tingling throughout BLE)       Communication   Communication Communication: No  apparent difficulties    Cognition Arousal: Alert Behavior During Therapy: WFL for tasks assessed/performed   PT - Cognitive impairments: No apparent impairments                         Following commands: Intact       Cueing       General Comments      Exercises     Assessment/Plan    PT Assessment Patient needs continued PT services  PT Problem List Decreased strength;Decreased activity tolerance;Decreased balance       PT Treatment Interventions DME instruction;Gait training;Stair training;Functional mobility training;Therapeutic activities;Therapeutic exercise;Balance training;Cognitive remediation;Patient/family education    PT Goals (Current goals can be found in the Care Plan section)  Acute Rehab PT Goals Patient Stated Goal: pt reports goal is HHPT, wants to regain strength and muscle PT Goal Formulation: With patient Time For Goal Achievement: 06/16/24 Potential to Achieve Goals: Good    Frequency Min 2X/week     Co-evaluation               AM-PAC PT 6 Clicks Mobility  Outcome Measure Help needed turning from your back to your side while in a flat bed without using bedrails?: A Little Help needed moving from lying on your back to sitting on the side of a flat bed without using bedrails?: A Little Help needed moving to and from a bed to a chair (including a wheelchair)?: A Little Help needed standing up from a chair using your arms (e.g., wheelchair or bedside chair)?: A Little Help needed to walk in hospital room?: A Little Help needed climbing 3-5 steps with a railing? : A Little 6 Click Score: 18    End of Session   Activity Tolerance: Patient tolerated treatment well Patient left: in bed;with call bell/phone within reach;with bed alarm set;with family/visitor present Nurse Communication: Mobility status PT Visit Diagnosis: Unsteadiness on feet (R26.81)    Time: 8761-8752 PT Time Calculation (min) (ACUTE ONLY): 9  min   Charges:   PT Evaluation $PT Eval Low Complexity: 1 Low   PT General Charges $$ ACUTE PT VISIT: 1 Visit         Tori Zaydon Kinser PT, DPT 06/02/24, 1:19 PM

## 2024-06-02 NOTE — Progress Notes (Signed)
 Brief oncology note: N/C  Patient seen briefly, introduced self and oncology services at [facility] to patient.  She is awake alert and oriented x 4 and is very pleasant.  Family member at bedside. Informed patient that she is on our inpatient oncology list and that we are waiting for the pathology results of her biopsy which was done 05/29/2024.  Depending upon results, she will be assigned to the appropriate disease specific oncologist.  Also informed patient that she will be called with the appropriate appointment even if she is discharged home prior to pathology results.   My cancer center card with contact information given to patient.  Answered all questions to her satisfaction.    Olam JINNY Brunner, NP

## 2024-06-03 ENCOUNTER — Other Ambulatory Visit: Payer: Self-pay | Admitting: Hematology and Oncology

## 2024-06-03 ENCOUNTER — Telehealth: Payer: Self-pay | Admitting: Oncology

## 2024-06-03 ENCOUNTER — Encounter: Payer: Self-pay | Admitting: Student

## 2024-06-03 ENCOUNTER — Other Ambulatory Visit: Payer: Self-pay | Admitting: Gynecologic Oncology

## 2024-06-03 DIAGNOSIS — J90 Pleural effusion, not elsewhere classified: Secondary | ICD-10-CM | POA: Diagnosis not present

## 2024-06-03 DIAGNOSIS — C778 Secondary and unspecified malignant neoplasm of lymph nodes of multiple regions: Secondary | ICD-10-CM | POA: Diagnosis not present

## 2024-06-03 DIAGNOSIS — C563 Malignant neoplasm of bilateral ovaries: Secondary | ICD-10-CM

## 2024-06-03 DIAGNOSIS — C786 Secondary malignant neoplasm of retroperitoneum and peritoneum: Secondary | ICD-10-CM | POA: Diagnosis not present

## 2024-06-03 DIAGNOSIS — C569 Malignant neoplasm of unspecified ovary: Secondary | ICD-10-CM

## 2024-06-03 DIAGNOSIS — J910 Malignant pleural effusion: Secondary | ICD-10-CM | POA: Diagnosis not present

## 2024-06-03 LAB — CULTURE, BLOOD (ROUTINE X 2)
Culture: NO GROWTH
Special Requests: ADEQUATE

## 2024-06-03 MED ORDER — MIRTAZAPINE 7.5 MG PO TABS: 7.5000 mg | ORAL_TABLET | Freq: Every day | ORAL | 0 refills | Status: AC

## 2024-06-03 MED ORDER — DOCUSATE SODIUM 100 MG PO CAPS: 100.0000 mg | ORAL_CAPSULE | Freq: Two times a day (BID) | ORAL | 0 refills | Status: AC

## 2024-06-03 MED ORDER — ONDANSETRON HCL 4 MG PO TABS: 4.0000 mg | ORAL_TABLET | Freq: Four times a day (QID) | ORAL | 0 refills | Status: AC | PRN

## 2024-06-03 MED ORDER — SENNOSIDES-DOCUSATE SODIUM 8.6-50 MG PO TABS: 1.0000 | ORAL_TABLET | Freq: Every day | ORAL | Status: AC

## 2024-06-03 MED ORDER — POLYETHYLENE GLYCOL 3350 17 G PO PACK: 17.0000 g | PACK | Freq: Two times a day (BID) | ORAL | 0 refills | Status: AC

## 2024-06-03 MED ORDER — HYDROCODONE-ACETAMINOPHEN 5-325 MG PO TABS: 1.0000 | ORAL_TABLET | ORAL | 0 refills | Status: DC | PRN

## 2024-06-03 MED ORDER — BISACODYL 5 MG PO TBEC: 10.0000 mg | DELAYED_RELEASE_TABLET | Freq: Every day | ORAL | 0 refills | Status: AC

## 2024-06-03 NOTE — TOC Transition Note (Signed)
 Transition of Care [facility]) - Discharge Note  Patient Details  Name: Jodi Cameron MRN: [id number] Date of Birth: 25-Feb-1956  Transition of Care [facility]) CM/SW Contact:  Duwaine GORMAN Aran, LCSW Phone Number: 06/03/2024, 11:11 AM  Clinical Narrative: PT evaluation recommended HHPT. Patient is agreeable to being set up and requested [facility]. CSW made [facility] referral to [facility] in hub, which was accepted. Hospitalist placed HHPT orders. CSW updated patient. Care management signing off.  Final next level of care: Home w Home Health Services Barriers to Discharge: Barriers Resolved  Patient Goals and CMS Choice Patient states their goals for this hospitalization and ongoing recovery are:: Get HHPT through [facility] CMS Medicare.gov Compare Post Acute Care list provided to:: Patient Choice offered to / list presented to : Patient  Discharge Plan and Services Additional resources added to the After Visit Summary for       DME Arranged: N/A DME Agency: NA HH Arranged: PT HH Agency: Other - See comment ([facility]) Date HH Agency Contacted: 06/03/24 Representative spoke with at [facility] Agency: Referral made in hub  Social Drivers of Health (SDOH) Interventions SDOH Screenings   Food Insecurity: No Food Insecurity (05/28/2024)  Housing: Low Risk (05/28/2024)  Transportation Needs: No Transportation Needs (05/28/2024)  Utilities: Not At Risk (05/28/2024)  Depression (PHQ2-9): High Risk (05/28/2024)  Social Connections: Socially Isolated (05/28/2024)  Tobacco Use: Medium Risk (05/29/2024)   Readmission Risk Interventions     No data to display

## 2024-06-03 NOTE — Consult Note (Signed)
 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 She has biopsy-proven poorly differentiated carcinoma of the left supraclavicular lymph node, overall staining pattern consistent with metastatic carcinoma of GYN She is not a surgical candidate She also had malignant pleural effusion   We discussed the role of systemic chemotherapy She had reasonable venous access and would not need a port I will get her scheduled to return to the outpatient clinic next week with chemo education class with plan to start chemotherapy on June 14, 2024  Malignant pleural effusion She had drainage place with improvement Continue incentive spirometry  Severe protein calorie malnutrition We discussed importance of frequent small meals and high-protein intake  Recent constipation We discussed importance of regular laxative therapy  Discharge planning Will defer to primary service Outpatient appointment is arranged next week   The total time spent in the appointment was 80 minutes encounter with patients including review of chart and various tests results, discussions about plan of care and coordination of care plan   All questions were answered. The patient knows to call the clinic with any problems, questions or concerns. No barriers to learning was detected.  Almarie Bedford, MD 1/22/202610:11 AM  CHIEF COMPLAINTS/PURPOSE OF CONSULTATION:  Metastatic ovarian cancer  HISTORY OF PRESENTING ILLNESS:  Jodi Cameron 69 y.o. female is seen at the request from primary service with presentation of ovarian cancer The patient had gradual weight loss for over a year with intentional dietary changes She started to feel unwell and weak 3 months ago She complained of cough and went to urgent care on January 14 and then complained of shortness of breath She was admitted  to the hospital on January 16; CT imaging showed large sided pleural effusion as well as malignant mass Subsequent CT imaging of the abdomen and pelvis revealed large pelvic mass She underwent thoracentesis on January 17, malignant cells are present Subsequently, she underwent CT-guided biopsy of supraclavicular mass  A. LYMPH NODE, LEFT SUPRACLAVICULAR, BIOPSY:  - Metastatic poorly differentiated carcinoma, see comment.   Comment:  The metastatic carcinoma is positive for PAX8 and WT1.  TTF-1 is negative.  This pattern of staining is consistent with metastatic carcinoma of GYN/ovarian origin.  Tumor marker, CA125 is elevated at 3663 Over the last few days, she is gradually improving and is ready to be discharged She had recent severe constipation, alleviated by laxatives Her appetite is poor She has lower abdominal pain and occasional bloating sensation  MEDICAL HISTORY:  History reviewed. No pertinent past medical history.  SURGICAL HISTORY: Past Surgical History:  Procedure Laterality Date   AUGMENTATION MAMMAPLASTY Bilateral    Subglandular, pt's second set of implants   BREAST ENHANCEMENT SURGERY     IR US  LIVER BIOPSY  05/31/2024   let rotator cuff surgery  08/2022   PARTIAL HYSTERECTOMY      SOCIAL HISTORY: Social History   Socioeconomic History   Marital status: Single    Spouse name: Not on file   Number of children: Not on file   Years of education: Not on file   Highest education level: Not on file  Occupational History   Not on file  Tobacco Use   Smoking status: Former    Current packs/day: 0.00    Types: Cigarettes    Quit date: 1986    Years since quitting: 40.0   Smokeless  tobacco: Never   Tobacco comments:    1 pack a week  Substance and Sexual Activity   Alcohol use: Yes    Comment: 1-2 times per month   Drug use: Not Currently   Sexual activity: Not Currently  Other Topics Concern   Not on file  Social History Narrative   Not on file    Social Drivers of Health   Tobacco Use: Medium Risk (05/29/2024)   Patient History    Smoking Tobacco Use: Former    Smokeless Tobacco Use: Never    Passive Exposure: Not on Actuary Strain: Not on file  Food Insecurity: No Food Insecurity (05/28/2024)   Epic    Worried About Programme Researcher, Broadcasting/film/video in the Last Year: Never true    Ran Out of Food in the Last Year: Never true  Transportation Needs: No Transportation Needs (05/28/2024)   Epic    Lack of Transportation (Medical): No    Lack of Transportation (Non-Medical): No  Physical Activity: Not on file  Stress: Not on file  Social Connections: Socially Isolated (05/28/2024)   Social Connection and Isolation Panel    Frequency of Communication with Friends and Family: More than three times a week    Frequency of Social Gatherings with Friends and Family: Once a week    Attends Religious Services: Never    Database Administrator or Organizations: No    Attends Banker Meetings: Never    Marital Status: Divorced  Catering Manager Violence: Not At Risk (05/28/2024)   Epic    Fear of Current or Ex-Partner: No    Emotionally Abused: No    Physically Abused: No    Sexually Abused: No  Depression (PHQ2-9): High Risk (05/28/2024)   Depression (PHQ2-9)    PHQ-2 Score: 15  Alcohol Screen: Not on file  Housing: Low Risk (05/28/2024)   Epic    Unable to Pay for Housing in the Last Year: No    Number of Times Moved in the Last Year: 0    Homeless in the Last Year: No  Utilities: Not At Risk (05/28/2024)   Epic    Threatened with loss of utilities: No  Health Literacy: Not on file    FAMILY HISTORY: Family History  Problem Relation Age of Onset   Alzheimer's disease Mother    Colon cancer Maternal Grandmother    Breast cancer Half-Sister     ALLERGIES:  is allergic to codeine.  MEDICATIONS:  Current Facility-Administered Medications  Medication Dose Route Frequency Provider Last Rate Last Admin    acetaminophen  (TYLENOL ) tablet 650 mg  650 mg Oral Q6H PRN Thomas, Sara-Maiz A, MD   650 mg at 05/29/24 1116   Or   acetaminophen  (TYLENOL ) suppository 650 mg  650 mg Rectal Q6H PRN Debby Camila LABOR, MD       albuterol  (PROVENTIL ) (2.5 MG/3ML) 0.083% nebulizer solution 2.5 mg  2.5 mg Nebulization Q2H PRN Debby Camila LABOR, MD       bisacodyl  (DULCOLAX) EC tablet 10 mg  10 mg Oral Daily Mathews, Elizabeth G, MD   10 mg at 06/02/24 1502   docusate sodium  (COLACE) capsule 100 mg  100 mg Oral BID Mathews, Elizabeth G, MD   100 mg at 06/02/24 2124   feeding supplement (ENSURE PLUS HIGH PROTEIN) liquid 237 mL  237 mL Oral TID BM Will Almarie MATSU, MD   237 mL at 06/03/24 0824   fluticasone  (FLONASE ) 50 MCG/ACT nasal spray 2  spray  2 spray Each Nare Daily [name], Saima, MD   2 spray at 06/03/24 9176   guaiFENesin  (ROBITUSSIN) 100 MG/5ML liquid 5 mL  5 mL Oral Q4H PRN Debby Camila LABOR, MD       heparin  injection 5,000 Units  5,000 Units Subcutaneous Q8H Allred, Darrell K, PA-C   5,000 Units at 06/02/24 1333   HYDROcodone -acetaminophen  (NORCO/VICODIN) 5-325 MG per tablet 1-2 tablet  1-2 tablet Oral Q4H PRN [name], Saima, MD   2 tablet at 06/03/24 9177   lip balm (CARMEX) ointment 1 Application  1 Application Topical PRN [name], Saima, MD       mirtazapine  (REMERON ) tablet 7.5 mg  7.5 mg Oral QHS Will Almarie MATSU, MD   7.5 mg at 06/02/24 2124   ondansetron  (ZOFRAN ) tablet 4 mg  4 mg Oral Q6H PRN Debby Camila LABOR, MD       Or   ondansetron  (ZOFRAN ) injection 4 mg  4 mg Intravenous Q6H PRN Thomas, Sara-Maiz A, MD   4 mg at 06/02/24 1735   polyethylene glycol (MIRALAX  / GLYCOLAX ) packet 17 g  17 g Oral BID Will Almarie MATSU, MD   17 g at 06/02/24 2124   senna-docusate (Senokot-S) tablet 1 tablet  1 tablet Oral QHS [name], Saima, MD   1 tablet at 06/02/24 2124   simethicone  (MYLICON) chewable tablet 80 mg  80 mg Oral Q6H PRN Chavez, Abigail, NP   80 mg at 06/02/24 1959   sodium chloride   flush (NS) 0.9 % injection 10 mL  10 mL Intrapleural Q8H Adrien Guan, Tamala, MD   10 mL at 06/02/24 1333   sodium phosphate (FLEET) enema 1 enema  1 enema Rectal Daily PRN [name], Saima, MD       zolpidem  (AMBIEN ) tablet 5 mg  5 mg Oral QHS PRN Mathews, Elizabeth G, MD   5 mg at 06/02/24 2134    REVIEW OF SYSTEMS:  All other systems were reviewed with the patient and are negative.  PHYSICAL EXAMINATION: ECOG PERFORMANCE STATUS: 2 - Symptomatic, <50% confined to bed  Vitals:   06/03/24 0407 06/03/24 0729  BP: 105/78 116/70  Pulse: 89 91  Resp: 18 16  Temp: 98.4 F (36.9 C) 98.1 F (36.7 C)  SpO2: 96% 95%   Filed Weights   05/28/24 1626  Weight: 140 lb (63.5 kg)    GENERAL:alert, no distress and comfortable.  She looks thin and cachectic ABDOMEN:abdomen soft, palpable suprapubic mass Musculoskeletal:no cyanosis of digits and no clubbing  PSYCH: alert & oriented x 3 with fluent speech NEURO: no focal motor/sensory deficits  LABORATORY DATA:  I have reviewed the data as listed Lab Results  Component Value Date   WBC 14.4 (H) 06/02/2024   HGB 10.6 (L) 06/02/2024   HCT 32.7 (L) 06/02/2024   MCV 83.0 06/02/2024   PLT 574 (H) 06/02/2024   Recent Labs    05/29/24 0340 05/30/24 0355 05/31/24 1159 06/02/24 0345  NA 133* 129* 135 129*  K 4.1 4.4 4.1 4.4  CL 98 95* 99 96*  CO2 27 27 30 25   GLUCOSE 135* 103* 124* 98  BUN 15 12 15 11   CREATININE 0.61 0.63 0.55 0.54  CALCIUM 8.8* 8.9 8.9 8.6*  GFRNONAA >60 >60 >60 >60  PROT 7.4 7.3  --  7.4  ALBUMIN 2.6* 2.6*  --  2.4*  AST 27 23  --  39  ALT 11 8  --  14  ALKPHOS 133* 124  --  153*  BILITOT 0.2 0.2  --  0.3    RADIOGRAPHIC STUDIES: I have personally reviewed the radiological images as listed and agreed with the findings in the report. CT CHEST WO CONTRAST Result Date: 06/02/2024 CLINICAL DATA:  Evaluate for pleural effusion. EXAM: CT CHEST WITHOUT CONTRAST TECHNIQUE: Multidetector CT imaging of the chest was  performed following the standard protocol without IV contrast. RADIATION DOSE REDUCTION: This exam was performed according to the departmental dose-optimization program which includes automated exposure control, adjustment of the mA and/or kV according to patient size and/or use of iterative reconstruction technique. COMPARISON:  Chest CT dated 05/28/2024. FINDINGS: Evaluation of this exam is limited in the absence of intravenous contrast. Cardiovascular: There is no cardiomegaly. Small pericardial effusion. The thoracic aorta and the central pulmonary arteries are grossly unremarkable on this noncontrast CT. Mediastinum/Nodes: Similar appearance of mediastinal adenopathy measuring up to 18 mm short axis in the prevascular space. The esophagus is grossly unremarkable. Partially calcified mass in the left lower neck measures 4.5 x 3.0 cm, likely adenopathy. Lungs/Pleura: Significant interval decrease in the size of the right pleural effusion status post catheter placement. The tip of the catheter is in the medial right lung base pleural surface. A small residual right pleural effusion as well as a small amount of air in the pleural space introduced via the catheter. There is partial consolidation of the right lower lobe with air bronchogram which may represent atelectasis or infiltrate. Trace left pleural effusion. The central airways are patent. Upper Abdomen: Retroperitoneal and retrocrural adenopathy as well as scattered omental masses consistent with implant. Musculoskeletal: Osteopenia with degenerative changes. No acute osseous pathology. Bilateral breast implants noted. IMPRESSION: 1. Significant interval decrease in the size of the right pleural effusion status post catheter placement. Small residual right pleural effusion as well as a small amount of air in the pleural space introduced via the catheter. 2. Partial consolidation of the right lower lobe with air bronchogram may represent atelectasis or  infiltrate. 3. Trace left pleural effusion. 4. Similar appearance of mediastinal adenopathy. 5. Retroperitoneal and retrocrural adenopathy as well as scattered omental masses consistent with implant. Electronically Signed   By: Vanetta Chou M.D.   On: 06/02/2024 12:30   DG CHEST PORT 1 VIEW Result Date: 06/01/2024 EXAM: 1 VIEW XRAY OF THE CHEST 06/01/2024 12:42:00 PM COMPARISON: 05/31/2024 CLINICAL HISTORY: Chest tube in place. FINDINGS: LINES, TUBES AND DEVICES: Right basilar chest tube in place. LUNGS AND PLEURA: Unchanged small right pleural effusion. Hazy opacity at right lung base, likely representing atelectasis. HEART AND MEDIASTINUM: No acute abnormality of the cardiac and mediastinal silhouettes. BONES AND SOFT TISSUES: No acute osseous abnormality. IMPRESSION: 1. Right basilar chest tube present with unchanged small right pleural effusion. 2. Hazy right basilar opacity, compatible with atelectasis. Electronically signed by: Dayne Hassell MD 06/01/2024 05:02 PM EST RP Workstation: [id number]   DG CHEST PORT 1 VIEW Result Date: 05/31/2024 CLINICAL DATA:  Pleural effusion. EXAM: PORTABLE CHEST 1 VIEW COMPARISON:  05/30/2024 FINDINGS: Right chest tube is stable with the pigtail portion in the medial right lower chest. Persistent right basilar densities have not significantly changed. Negative for pneumothorax. Left lung is clear. Heart size is within normal limits and stable. IMPRESSION: 1. Right chest tube is stable in position. Negative for pneumothorax. 2. Persistent densities in the right lower chest. Findings are most compatible with residual pleural fluid and atelectasis. Residual pleural fluid may be loculated and could be confirmed with ultrasound if needed. Electronically Signed   By:  Juliene Balder M.D.   On: 05/31/2024 14:25   IR LYMPH NODE CORE BIOPSY Result Date: 05/31/2024 INDICATION: 69 year old with evidence of metastatic disease and tissue diagnosis is needed. Patient has enlarged  left supraclavicular lymph nodes. EXAM: ULTRASOUND-GUIDED CORE BIOPSY OF A LEFT SUPRACLAVICULAR LYMPH NODE MEDICATIONS: Moderate sedation ANESTHESIA/SEDATION: Moderate (conscious) sedation was employed during this procedure. A total of Versed  2 mg and Fentanyl  100 mcg was administered intravenously by the radiology nurse. Total intra-service moderate Sedation Time: 10 minutes. The patient's level of consciousness and vital signs were monitored continuously by radiology nursing throughout the procedure under my direct supervision. FLUOROSCOPY TIME:  None COMPLICATIONS: None immediate. PROCEDURE: Informed written consent was obtained from the patient after a thorough discussion of the procedural risks, benefits and alternatives. All questions were addressed. Maximal Sterile Barrier Technique was utilized including caps, mask, sterile gowns, sterile gloves, sterile drape, hand hygiene and skin antiseptic. A timeout was performed prior to the initiation of the procedure. Ultrasound was used to identify enlarged left supraclavicular lymph nodes. Left supraclavicular area was prepped with chlorhexidine and sterile field was created. Skin was anesthetized using 1% lidocaine . Small incision was made. Using ultrasound guidance, 17 gauge coaxial needle was directed into an enlarged lymph node. Multiple core biopsies were obtained with an 18 gauge device. Specimens placed on a Telfa pad with saline. 17 gauge coaxial needle was removed. Bandage placed over the puncture site. FINDINGS: Multiple enlarged left supraclavicular lymph nodes. A solid hypoechoic lymph node was biopsied. Biopsy needle confirmed within the lymph node. No immediate bleeding or hematoma formation. IMPRESSION: Ultrasound-guided core biopsy from an enlarged left supraclavicular lymph node. Electronically Signed   By: Juliene Balder M.D.   On: 05/31/2024 14:22   DG CHEST PORT 1 VIEW Result Date: 05/30/2024 EXAM: 1 VIEW(S) XRAY OF THE CHEST 05/30/2024 05:30:00  AM COMPARISON: Portable chest yesterday at 11:44 am. CLINICAL HISTORY: Pleural effusion. FINDINGS: LINES, TUBES AND DEVICES: There is a pigtail right chest tube with the pigtail in the medial base of the right thorax. LUNGS AND PLEURA: There is moderate right pleural effusion, either improved or redistributed . There is Patchy airspace disease in the right mid lung overlying the effusion, and consolidation or atelectasis in the adjacent right base. The remainder of the lungs are clear. Moderate right pleural effusion remains, either improved or redistributed. No pneumothorax. HEART AND MEDIASTINUM: No acute abnormality of the cardiac and mediastinal silhouettes. BONES AND SOFT TISSUES: No acute osseous abnormality. IMPRESSION: 1. Moderate right pleural effusion, either improved or redistributed, with a pigtail right chest tube in place. 2. Patchy airspace disease in the right mid lung overlying the effusion, and consolidation or atelectasis in the adjacent right base. Electronically signed by: Francis Quam MD 05/30/2024 06:00 AM EST RP Workstation: [id number]   CT ABDOMEN PELVIS W CONTRAST Result Date: 05/29/2024 EXAM: CT ABDOMEN AND PELVIS WITH CONTRAST 05/29/2024 04:58:02 PM TECHNIQUE: CT of the abdomen and pelvis was performed with the administration of 100 mL of iohexol  (OMNIPAQUE ) 300 MG/ML solution. Multiplanar reformatted images are provided for review. Automated exposure control, iterative reconstruction, and/or weight-based adjustment of the mA/kV was utilized to reduce the radiation dose to as low as reasonably achievable. COMPARISON: Partial comparison CT chest dated 05/28/2024. CLINICAL HISTORY: Metastatic disease evaluation. FINDINGS: LOWER CHEST: Moderate right pleural effusion with indwelling pleural drain. Right middle lobe and right lower lobe atelectasis/collapse. Retrocrural nodal metastases measuring up to 3.0 cm on the right (image 14). LIVER: Subcentimeter posterior right hepatic cyst,  benign. GALLBLADDER AND BILE DUCTS: Gallbladder is unremarkable. No biliary ductal dilatation. SPLEEN: No acute abnormality. PANCREAS: No acute abnormality. ADRENAL GLANDS: No acute abnormality. KIDNEYS, URETERS AND BLADDER: No stones in the kidneys or ureters. No hydronephrosis. No perinephric or periureteral stranding. Urinary bladder is unremarkable. GI AND BOWEL: Stomach demonstrates no acute abnormality. Additional peritoneal disease in the left upper abdomen adjacent to the stomach and spleen (image 20). There is no bowel obstruction. PERITONEUM AND RETROPERITONEUM: Small volume pelvic ascites. No free air. 8.4 x 4.7 cm mass beneath the left lower anterior abdominal wall (image 61), and additional 7.0 x 4.5 cm mass beneath the midline anterior abdominal wall (image 50), suspicious for peritoneal disease. VASCULATURE: Aorta is normal in caliber. LYMPH NODES: Pelvic/retroperitoneal nodal metastases, including a dominant 3.3 cm left periaortic node (image 34). REPRODUCTIVE ORGANS: Large, lobulated pelvic mass inseparable from the uterus measuring 14.7 x 12.6 cm (image 68), compatible with uterine malignancy. BONES AND SOFT TISSUES: No acute osseous abnormality. Bilateral breast augmentation. IMPRESSION: 1. Large, lobulated pelvic mass inseparable from the uterus measuring 14.7 cm, compatible with uterine malignancy. 2. Pelvic, retroperitoneal, and retrocrural nodal metastases. 3. Peritoneal disease, as above, with small volume pelvic ascites. 4. Moderate right pleural effusion with indwelling pleural drain. Electronically signed by: Pinkie Pebbles MD 05/29/2024 08:12 PM EST RP Workstation: [id number]   DG CHEST PORT 1 VIEW Result Date: 05/29/2024 EXAM: 1 VIEW(S) XRAY OF THE CHEST 05/29/2024 11:47:00 AM COMPARISON: 05/28/2024 CLINICAL HISTORY: Encounter for chest tube placement FINDINGS: LINES, TUBES AND DEVICES: Right chest tube with pigtail overlying the medial right lower hemithorax. LUNGS AND PLEURA:  Large right pleural effusion, decreased compared to prior exam. No focal pulmonary opacity. No pneumothorax. HEART AND MEDIASTINUM: No acute abnormality of the cardiac and mediastinal silhouettes. BONES AND SOFT TISSUES: Cystic changes in the right glenoid, likely degenerative. IMPRESSION: 1. Right chest tube with pigtail overlying the medial right lower hemithorax. 2. Large right pleural effusion, decreased. Electronically signed by: Waddell Calk MD 05/29/2024 12:00 PM EST RP Workstation: [id number]   CT Chest Wo Contrast Result Date: 05/28/2024 EXAM: CT CHEST WITHOUT CONTRAST 05/28/2024 06:23:00 PM TECHNIQUE: CT of the chest was performed without the administration of intravenous contrast. Multiplanar reformatted images are provided for review. Automated exposure control, iterative reconstruction, and/or weight based adjustment of the mA/kV was utilized to reduce the radiation dose to as low as reasonably achievable. COMPARISON: 10/26/2024, 05/26/2024 CLINICAL HISTORY: Pleural effusion, malignancy suspected. FINDINGS: MEDIASTINUM: Heart and pericardium are unremarkable. The central airways are clear. Significant mass effect and rightward shift of the cardiomediastinal structures due to the pleural effusion. Enlarged left lower neck lymph nodes with stippled calcification throughout, the largest measuring 3.5 cm in transverse dimension (axial 14). These lymph nodes cause significant mass effect, rightward shift, and severe narrowing of the trachea of the thoracic inlet. LYMPH NODES: Multiple enlarged mediastinal lymph nodes. For example, there is a prevascular lymph node measuring 1.5 cm (axial 34), containing stippled calcification. Multiple enlarged AP window lymph nodes measuring up to 2 cm (axial 57). Clustered enlarged retrocrural lymph nodes measuring up to 1.7 cm on the right (axial 117). Cardiophrenic lymph nodes are also prominent measuring up to 8 mm (axial 95). Enlarged superior left retroperitoneal  lymph node measuring up to 1.6 cm (axial 145). LUNGS AND PLEURA: Redemonstrated large right pleural effusion with complete collapse of the right middle and right lower lobes. Near complete collapse of the right upper lobe is also present. No pneumothorax. SOFT TISSUES/BONES: Multilevel thoracic osteophytosis.  Peripherally calcified bilateral breast implants. Findings suggestive of intracapsular rupture noted. UPPER ABDOMEN: Limited images of the upper abdomen demonstrates no acute abnormality. Enlarged superior left retroperitoneal lymph node measuring up to 1.6 cm (axial 145). IMPRESSION: 1. Large right pleural effusion with complete collapse of the right middle and right lower lobes, near complete collapse of the right upper lobe, and significant mass effect and rightward shift of the cardiomediastinal structures. Underlying pulmonary neoplasm or hilar neoplasm is difficult to exclude given the lack of intravenous contrast. 2. Enlarged left lower neck lymph nodes with stippled calcification causing significant mass effect, rightward shift, and severe narrowing of the trachea at the thoracic inlet. This is consistent with metastatic disease. Clinical correlation for patient's respiratory status requested. 3. Extensive lymphadenopathy involving the mediastinum, retrocrural region, and superior left retroperitoneum, some with stippled calcification, consistent with either metastatic lymphadenopathy or underlying lymphoma. Electronically signed by: Rogelia Myers MD 05/28/2024 06:54 PM EST RP Workstation: [id number]   DG Chest Port 1 View Result Date: 05/28/2024 CLINICAL DATA:  Shortness of breath and cough EXAM: PORTABLE CHEST 1 VIEW COMPARISON:  05/28/2024 FINDINGS: Large right-sided pleural effusion, increased compared to prior. Underlying airspace disease. Stable cardiomediastinal silhouette with aortic atherosclerosis. No pneumothorax. Bilateral breast implants. IMPRESSION: Large right-sided pleural effusion,  increased compared to prior. Underlying pneumonia or mass not excluded. Electronically Signed   By: Luke Bun M.D.   On: 05/28/2024 17:56   DG Chest 2 View Result Date: 05/26/2024 CLINICAL DATA:  Cough for 4 weeks, right chest pain. EXAM: CHEST - 2 VIEW COMPARISON:  None Available. FINDINGS: Trachea is midline. Heart is at the upper limits of normal in size. Large right pleural effusion with collapse/consolidation in the right lung base. Left lung is clear. Degenerative changes in the spine. IMPRESSION: Large right pleural effusion with collapse/consolidation in the right lung base. Findings may be due to pneumonia with a parapneumonic effusion. Empyema cannot be excluded, nor can underlying malignancy. Followup PA and lateral chest X-ray is recommended in 3-4 weeks following trial of antibiotic therapy to ensure resolution and exclude underlying malignancy. Electronically Signed   By: Newell Eke M.D.   On: 05/26/2024 10:38

## 2024-06-03 NOTE — Progress Notes (Signed)
 Gynecologic Oncology Progress Note  Date: 06/03/2024   From initial consult with Dr. Rogelio: Jodi Cameron is a 69 y.o. female who initially presented to ED on 05/28/2024 w/ shortness of breath and cough.  She endorsed general malaise, anorexia, fatigue, early satiety, difficulty swallowing and nasal congestion x 1 mth.  No vomiting, but she feels full quickly after eating, experiences frequent urination, and bloating. She also reports constipation, which she attempted to address with a cleanse after returning from a trip in August 2025. She has lost 32 pounds intentionally since June 2024.She was recently treated w/an outpt regimen for pneumonia.  She later presented to her PCP and was found to be tachypneic and tachycardic.     She has experienced sharp stomach cramps for at least two years, often waking her at night. Initially sporadic, occurring once a month or less, the cramps have worsened over time. She sought medical attention two years ago, but no diagnosis was made at that time.   She has a history of smoking for 30 years, with intermittent cessation, most recently quitting for nine months before resuming after her trip.   She underwent a hysterectomy due to fibroids after the birth of her son, Jodi Cameron, who is now 82 years old. She feels her cervix was preserved. Her ovaries were left conserved. No vaginal bleeding. She reports a sensation of 'feeling hollow' over the past two weeks, describing it as feeling like something is dropping inside her, although there is no actual dropping sensation.   Mammogram in 11/2022 was benign. There is a family h/o breast cancer.    During this admission, a exam was remarkable for left-sided cervical lymphadenopathy w/secondary tracheal narrowing. She is s/p chest tube placement for a large right pleural effusion causing a collapsed lung.   Interval: Reports throwing up after having a ham and cheese sandwich last pm. Going to plan on getting protein rich  snacks for home at discharge. Breathing seems ok but has started having a cough. Has been using IS. Abdomen remains tender. She has to wear loose fitting clothing due to this. Able to move around more with chest tube out.    Medications: I have reviewed the patient's current medications.   Recent labs reviewed including pathology results, tumor markers. Imaging reviewed.      06/03/2024    7:29 AM 06/03/2024    4:07 AM 06/02/2024    8:35 PM  Vitals with BMI  Systolic 116 105 876  Diastolic 70 78 83  Pulse 91 89 95    Physical Exam Constitutional:      Comments: cachetic   Chest: Lungs clear with breathing unlabored. Mildly tachycardic Abdominal:     Comments: Firm (more in lower quadrants from mass effect), mild distension, slightly tender on palpation, active bowel sounds. Musculoskeletal:     Right lower leg: No edema.     Left lower leg: No edema.  Skin:    General: Skin is warm and dry.  Neurological:     General: No focal deficit present.     Mental Status: She is alert, oriented, in no acute distress, resting in bed.  Psychiatric:        Mood and Affect: Mood normal.   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 Cytology from pleural fluid returning with metastatic carcinoma. 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   Per patient, planning for discharge later today. Advised if not seen by Med Onc as an inpatient before she leaves, will arrange for outpatient follow up. Patient has our contact info if needed. Please see addendum to consult note from Dr. Viktoria for additional details including plan etc.

## 2024-06-03 NOTE — Discharge Summary (Signed)
 Physician Discharge Summary  Kimyata Milich FMW:[id number] DOB: [birth year] DOA: 05/28/2024  PCP: Wheeler Harlene CROME, NP  Admit date: 05/28/2024 Discharge date: 06/03/2024  Admitted From: Home Disposition: Home Recommendations for Outpatient Follow-up:  Follow up with PCP in 1-2 weeks Please obtain BMP/CBC in one week Please follow up with GYN oncology Dr. Viktoria, with Dr. Lonn medical oncology, PCP   Home Health: Physical therapy Equipment/Devices none  Discharge Condition: Stable  CODE STATUS: Full code Diet recommendation: Cardiac Brief/Interim Summary: 69 y/o F with tobacco abuse presented with dyspnea. She was recently given Augmentin  and Z pak but symptoms did not improve.  RR in 20s and HR in 110-120 range, p ox 94-96%  CT  chest shows:   Large R pleural effusion with complete collapse of R lung, left lower neck lymphadenopathy with mass affect on trachea and lymphadenopathy in mediastinum, retrocrural area and superior left retroperitoneal area.   Noted to have dyspnea and tachypnea but no hypoxia.  1/17 Chest tube placed and CT abd/pelvis ordered-   Large, lobulated pelvic mass inseparable from the uterus measuring 14.7 cm,compatible with uterine malignancy.Pelvic, retroperitoneal, and retrocrural nodal metastases.Peritoneal disease, as above, with small volume pelvic ascites. Moderate right pleural effusion with indwelling pleural drain.  1/18 Gyn Onc consulted 1/20 Ca 125 -3,663  CT chest 1/21 . Significant interval decrease in the size of the right pleural effusion status post catheter placement. Small residual right pleural effusion as well as a small amount of air in the pleural space introduced via the catheter.Partial consolidation of the right lower lobe with air bronchogram may represent atelectasis or infiltrate.Trace left pleural effusion.Similar appearance of mediastinal adenopathy.  Discharge Diagnoses:  Principal Problem:   Pleural effusion on right Active  Problems:   Malignant neoplasm of both ovaries (HCC)   Mass, ovarian   Adenopathy  Pleural effusion on right,chest and abdominal lymphadenopathy, abdominal mass-5 L of bloody fluid removed on the first day of hospitalization from the right chest.  She was initially given Rocephin  azithromycin .  GYN oncology was consulted.  CA125 was 3663.  CEA was less than 0.6.  She completed 5 days of antibiotics.  Cytology came back positive for metastatic carcinoma.  Lymph node biopsy was positive for poorly differentiated carcinoma with pattern of staining consistent with ovarian carcinoma.  Patient was seen by oncology during hospital stay.  She will follow-up with GYN and medical oncology on discharge.    Nicotine abuse - counseling done - quit 3 wks ago - no cravings   Hyponatremia - U sodium < 30, Sosm 285, Uosm 250   PND - Flonase  has helped   Hypoalbuminemia - due to chronic illness  - Albumin 2.6 - cont Ensure BID - she states she is eating well when food brought from home but today only wants small amounts of oatmeal - nutrition consult   Elevated alk phos - follow- may be related to lymphadenopathy   Anemia - Hgb 12.6> 10.9 - iron sat 9, TIBC 190, Ferritin 747, MCV normal - likely AOCD   Constipation she had multiple bowel movements with MiraLAX  senna and Dulcolax.  With the 14.1 cm pelvic mass she is prone to constipation and encouraged her to stay on stool softeners daily.  Nutrition Problem: Severe Malnutrition Etiology: acute illness    Signs/Symptoms: severe fat depletion, severe muscle depletion     Interventions: Refer to RD note for recommendations, Ensure Enlive (each supplement provides 350kcal and 20 grams of protein), Education  Estimated body mass index is  22.6 kg/m as calculated from the following:   Height as of this encounter: 5' 6 (1.676 m).   Weight as of this encounter: 63.5 kg.  Discharge Instructions  Discharge Instructions     Face-to-face  encounter (required for Medicare/Medicaid patients)   Complete by: As directed    I Almarie KANDICE Hoots certify that this patient is under my care and that I, or a nurse practitioner or physician's assistant working with me, had a face-to-face encounter that meets the physician face-to-face encounter requirements with this patient on 06/03/2024. The encounter with the patient was in whole, or in part for the following medical condition(s) which is the primary reason for home health care (List medical condition):   The encounter with the patient was in whole, or in part, for the following medical condition, which is the primary reason for home health care: ovarian malignancy   I certify that, based on my findings, the following services are medically necessary home health services: Physical therapy   Reason for Medically Necessary Home Health Services: Therapy- Investment Banker, Operational, Patent Examiner   My clinical findings support the need for the above services: Unable to leave home safely without assistance and/or assistive device   Further, I certify that my clinical findings support that this patient is homebound due to: Unable to leave home safely without assistance   Home Health   Complete by: As directed    To provide the following care/treatments: PT   Increase activity slowly   Complete by: As directed    No wound care   Complete by: As directed       Allergies as of 06/03/2024       Reactions   Codeine Itching        Medication List     STOP taking these medications    amoxicillin -clavulanate 875-125 MG tablet Commonly known as: AUGMENTIN    azithromycin  250 MG tablet Commonly known as: Zithromax  Z-Pak   valACYclovir  500 MG tablet Commonly known as: Valtrex        TAKE these medications    bisacodyl  5 MG EC tablet Commonly known as: DULCOLAX Take 2 tablets (10 mg total) by mouth daily.   chlorpheniramine-HYDROcodone  10-8 MG/5ML Commonly known as:  TUSSIONEX Take 5 mLs by mouth every 12 (twelve) hours as needed for cough.   docusate sodium  100 MG capsule Commonly known as: COLACE Take 1 capsule (100 mg total) by mouth 2 (two) times daily.   Fish Oil 1000 MG Caps Take by mouth.   HYDROcodone -acetaminophen  5-325 MG tablet Commonly known as: NORCO/VICODIN Take 1-2 tablets by mouth every 4 (four) hours as needed for moderate pain (pain score 4-6) or severe pain (pain score 7-10).   mirtazapine  7.5 MG tablet Commonly known as: REMERON  Take 1 tablet (7.5 mg total) by mouth at bedtime.   multivitamin tablet Take 1 tablet by mouth daily.   ondansetron  4 MG tablet Commonly known as: ZOFRAN  Take 1 tablet (4 mg total) by mouth every 6 (six) hours as needed for nausea.   polyethylene glycol 17 g packet Commonly known as: MIRALAX  / GLYCOLAX  Take 17 g by mouth 2 (two) times daily.   pyridOXINE 50 MG tablet Commonly known as: B-6 Take 50 mg by mouth daily.   QC Tumeric Complex 500 MG Caps Generic drug: Turmeric Take by mouth.   senna-docusate 8.6-50 MG tablet Commonly known as: Senokot-S Take 1 tablet by mouth at bedtime.   Vitamin D3 1.25 MG (50000 UT) Tabs Take  by mouth.   vitamin E 200 UNIT capsule Take 200 Units by mouth daily.   Zinc 100 MG Tabs Take by mouth.        Contact information for follow-up providers     Yacopino, Jessica L, NP. Call in 1 week(s).   Specialty: Nurse Practitioner Contact information: [address] [place] [address] [phone]              Contact information for after-discharge care     Home Medical Care     [facility]) .   Service: Home Health Services Why: PT will be provided in the home after discharge. Contact information: [address] [place]  [address] [phone]                    Allergies[1]  Consultations: GYN oncology and medical oncology   Procedures/Studies: CT CHEST  WO CONTRAST Result Date: 06/02/2024 CLINICAL DATA:  Evaluate for pleural effusion. EXAM: CT CHEST WITHOUT CONTRAST TECHNIQUE: Multidetector CT imaging of the chest was performed following the standard protocol without IV contrast. RADIATION DOSE REDUCTION: This exam was performed according to the departmental dose-optimization program which includes automated exposure control, adjustment of the mA and/or kV according to patient size and/or use of iterative reconstruction technique. COMPARISON:  Chest CT dated 05/28/2024. FINDINGS: Evaluation of this exam is limited in the absence of intravenous contrast. Cardiovascular: There is no cardiomegaly. Small pericardial effusion. The thoracic aorta and the central pulmonary arteries are grossly unremarkable on this noncontrast CT. Mediastinum/Nodes: Similar appearance of mediastinal adenopathy measuring up to 18 mm short axis in the prevascular space. The esophagus is grossly unremarkable. Partially calcified mass in the left lower neck measures 4.5 x 3.0 cm, likely adenopathy. Lungs/Pleura: Significant interval decrease in the size of the right pleural effusion status post catheter placement. The tip of the catheter is in the medial right lung base pleural surface. A small residual right pleural effusion as well as a small amount of air in the pleural space introduced via the catheter. There is partial consolidation of the right lower lobe with air bronchogram which may represent atelectasis or infiltrate. Trace left pleural effusion. The central airways are patent. Upper Abdomen: Retroperitoneal and retrocrural adenopathy as well as scattered omental masses consistent with implant. Musculoskeletal: Osteopenia with degenerative changes. No acute osseous pathology. Bilateral breast implants noted. IMPRESSION: 1. Significant interval decrease in the size of the right pleural effusion status post catheter placement. Small residual right pleural effusion as well as a small  amount of air in the pleural space introduced via the catheter. 2. Partial consolidation of the right lower lobe with air bronchogram may represent atelectasis or infiltrate. 3. Trace left pleural effusion. 4. Similar appearance of mediastinal adenopathy. 5. Retroperitoneal and retrocrural adenopathy as well as scattered omental masses consistent with implant. Electronically Signed   By: Vanetta Chou M.D.   On: 06/02/2024 12:30   DG CHEST PORT 1 VIEW Result Date: 06/01/2024 EXAM: 1 VIEW XRAY OF THE CHEST 06/01/2024 12:42:00 PM COMPARISON: 05/31/2024 CLINICAL HISTORY: Chest tube in place. FINDINGS: LINES, TUBES AND DEVICES: Right basilar chest tube in place. LUNGS AND PLEURA: Unchanged small right pleural effusion. Hazy opacity at right lung base, likely representing atelectasis. HEART AND MEDIASTINUM: No acute abnormality of the cardiac and mediastinal silhouettes. BONES AND SOFT TISSUES: No acute osseous abnormality. IMPRESSION: 1. Right basilar chest tube present with unchanged small right pleural effusion. 2. Hazy right  basilar opacity, compatible with atelectasis. Electronically signed by: Dayne Hassell MD 06/01/2024 05:02 PM EST RP Workstation: [id number]   DG CHEST PORT 1 VIEW Result Date: 05/31/2024 CLINICAL DATA:  Pleural effusion. EXAM: PORTABLE CHEST 1 VIEW COMPARISON:  05/30/2024 FINDINGS: Right chest tube is stable with the pigtail portion in the medial right lower chest. Persistent right basilar densities have not significantly changed. Negative for pneumothorax. Left lung is clear. Heart size is within normal limits and stable. IMPRESSION: 1. Right chest tube is stable in position. Negative for pneumothorax. 2. Persistent densities in the right lower chest. Findings are most compatible with residual pleural fluid and atelectasis. Residual pleural fluid may be loculated and could be confirmed with ultrasound if needed. Electronically Signed   By: Juliene Balder M.D.   On: 05/31/2024 14:25   IR  LYMPH NODE CORE BIOPSY Result Date: 05/31/2024 INDICATION: 69 year old with evidence of metastatic disease and tissue diagnosis is needed. Patient has enlarged left supraclavicular lymph nodes. EXAM: ULTRASOUND-GUIDED CORE BIOPSY OF A LEFT SUPRACLAVICULAR LYMPH NODE MEDICATIONS: Moderate sedation ANESTHESIA/SEDATION: Moderate (conscious) sedation was employed during this procedure. A total of Versed  2 mg and Fentanyl  100 mcg was administered intravenously by the radiology nurse. Total intra-service moderate Sedation Time: 10 minutes. The patient's level of consciousness and vital signs were monitored continuously by radiology nursing throughout the procedure under my direct supervision. FLUOROSCOPY TIME:  None COMPLICATIONS: None immediate. PROCEDURE: Informed written consent was obtained from the patient after a thorough discussion of the procedural risks, benefits and alternatives. All questions were addressed. Maximal Sterile Barrier Technique was utilized including caps, mask, sterile gowns, sterile gloves, sterile drape, hand hygiene and skin antiseptic. A timeout was performed prior to the initiation of the procedure. Ultrasound was used to identify enlarged left supraclavicular lymph nodes. Left supraclavicular area was prepped with chlorhexidine and sterile field was created. Skin was anesthetized using 1% lidocaine . Small incision was made. Using ultrasound guidance, 17 gauge coaxial needle was directed into an enlarged lymph node. Multiple core biopsies were obtained with an 18 gauge device. Specimens placed on a Telfa pad with saline. 17 gauge coaxial needle was removed. Bandage placed over the puncture site. FINDINGS: Multiple enlarged left supraclavicular lymph nodes. A solid hypoechoic lymph node was biopsied. Biopsy needle confirmed within the lymph node. No immediate bleeding or hematoma formation. IMPRESSION: Ultrasound-guided core biopsy from an enlarged left supraclavicular lymph node.  Electronically Signed   By: Juliene Balder M.D.   On: 05/31/2024 14:22   DG CHEST PORT 1 VIEW Result Date: 05/30/2024 EXAM: 1 VIEW(S) XRAY OF THE CHEST 05/30/2024 05:30:00 AM COMPARISON: Portable chest yesterday at 11:44 am. CLINICAL HISTORY: Pleural effusion. FINDINGS: LINES, TUBES AND DEVICES: There is a pigtail right chest tube with the pigtail in the medial base of the right thorax. LUNGS AND PLEURA: There is moderate right pleural effusion, either improved or redistributed . There is Patchy airspace disease in the right mid lung overlying the effusion, and consolidation or atelectasis in the adjacent right base. The remainder of the lungs are clear. Moderate right pleural effusion remains, either improved or redistributed. No pneumothorax. HEART AND MEDIASTINUM: No acute abnormality of the cardiac and mediastinal silhouettes. BONES AND SOFT TISSUES: No acute osseous abnormality. IMPRESSION: 1. Moderate right pleural effusion, either improved or redistributed, with a pigtail right chest tube in place. 2. Patchy airspace disease in the right mid lung overlying the effusion, and consolidation or atelectasis in the adjacent right base. Electronically signed by: Francis Quam MD 05/30/2024  06:00 AM EST RP Workstation: [id number]   CT ABDOMEN PELVIS W CONTRAST Result Date: 05/29/2024 EXAM: CT ABDOMEN AND PELVIS WITH CONTRAST 05/29/2024 04:58:02 PM TECHNIQUE: CT of the abdomen and pelvis was performed with the administration of 100 mL of iohexol  (OMNIPAQUE ) 300 MG/ML solution. Multiplanar reformatted images are provided for review. Automated exposure control, iterative reconstruction, and/or weight-based adjustment of the mA/kV was utilized to reduce the radiation dose to as low as reasonably achievable. COMPARISON: Partial comparison CT chest dated 05/28/2024. CLINICAL HISTORY: Metastatic disease evaluation. FINDINGS: LOWER CHEST: Moderate right pleural effusion with indwelling pleural drain. Right middle lobe and  right lower lobe atelectasis/collapse. Retrocrural nodal metastases measuring up to 3.0 cm on the right (image 14). LIVER: Subcentimeter posterior right hepatic cyst, benign. GALLBLADDER AND BILE DUCTS: Gallbladder is unremarkable. No biliary ductal dilatation. SPLEEN: No acute abnormality. PANCREAS: No acute abnormality. ADRENAL GLANDS: No acute abnormality. KIDNEYS, URETERS AND BLADDER: No stones in the kidneys or ureters. No hydronephrosis. No perinephric or periureteral stranding. Urinary bladder is unremarkable. GI AND BOWEL: Stomach demonstrates no acute abnormality. Additional peritoneal disease in the left upper abdomen adjacent to the stomach and spleen (image 20). There is no bowel obstruction. PERITONEUM AND RETROPERITONEUM: Small volume pelvic ascites. No free air. 8.4 x 4.7 cm mass beneath the left lower anterior abdominal wall (image 61), and additional 7.0 x 4.5 cm mass beneath the midline anterior abdominal wall (image 50), suspicious for peritoneal disease. VASCULATURE: Aorta is normal in caliber. LYMPH NODES: Pelvic/retroperitoneal nodal metastases, including a dominant 3.3 cm left periaortic node (image 34). REPRODUCTIVE ORGANS: Large, lobulated pelvic mass inseparable from the uterus measuring 14.7 x 12.6 cm (image 68), compatible with uterine malignancy. BONES AND SOFT TISSUES: No acute osseous abnormality. Bilateral breast augmentation. IMPRESSION: 1. Large, lobulated pelvic mass inseparable from the uterus measuring 14.7 cm, compatible with uterine malignancy. 2. Pelvic, retroperitoneal, and retrocrural nodal metastases. 3. Peritoneal disease, as above, with small volume pelvic ascites. 4. Moderate right pleural effusion with indwelling pleural drain. Electronically signed by: Pinkie Pebbles MD 05/29/2024 08:12 PM EST RP Workstation: [id number]   DG CHEST PORT 1 VIEW Result Date: 05/29/2024 EXAM: 1 VIEW(S) XRAY OF THE CHEST 05/29/2024 11:47:00 AM COMPARISON: 05/28/2024 CLINICAL HISTORY:  Encounter for chest tube placement FINDINGS: LINES, TUBES AND DEVICES: Right chest tube with pigtail overlying the medial right lower hemithorax. LUNGS AND PLEURA: Large right pleural effusion, decreased compared to prior exam. No focal pulmonary opacity. No pneumothorax. HEART AND MEDIASTINUM: No acute abnormality of the cardiac and mediastinal silhouettes. BONES AND SOFT TISSUES: Cystic changes in the right glenoid, likely degenerative. IMPRESSION: 1. Right chest tube with pigtail overlying the medial right lower hemithorax. 2. Large right pleural effusion, decreased. Electronically signed by: Waddell Calk MD 05/29/2024 12:00 PM EST RP Workstation: [id number]   CT Chest Wo Contrast Result Date: 05/28/2024 EXAM: CT CHEST WITHOUT CONTRAST 05/28/2024 06:23:00 PM TECHNIQUE: CT of the chest was performed without the administration of intravenous contrast. Multiplanar reformatted images are provided for review. Automated exposure control, iterative reconstruction, and/or weight based adjustment of the mA/kV was utilized to reduce the radiation dose to as low as reasonably achievable. COMPARISON: 10/26/2024, 05/26/2024 CLINICAL HISTORY: Pleural effusion, malignancy suspected. FINDINGS: MEDIASTINUM: Heart and pericardium are unremarkable. The central airways are clear. Significant mass effect and rightward shift of the cardiomediastinal structures due to the pleural effusion. Enlarged left lower neck lymph nodes with stippled calcification throughout, the largest measuring 3.5 cm in transverse dimension (axial 14). These lymph nodes cause significant  mass effect, rightward shift, and severe narrowing of the trachea of the thoracic inlet. LYMPH NODES: Multiple enlarged mediastinal lymph nodes. For example, there is a prevascular lymph node measuring 1.5 cm (axial 34), containing stippled calcification. Multiple enlarged AP window lymph nodes measuring up to 2 cm (axial 57). Clustered enlarged retrocrural lymph nodes  measuring up to 1.7 cm on the right (axial 117). Cardiophrenic lymph nodes are also prominent measuring up to 8 mm (axial 95). Enlarged superior left retroperitoneal lymph node measuring up to 1.6 cm (axial 145). LUNGS AND PLEURA: Redemonstrated large right pleural effusion with complete collapse of the right middle and right lower lobes. Near complete collapse of the right upper lobe is also present. No pneumothorax. SOFT TISSUES/BONES: Multilevel thoracic osteophytosis. Peripherally calcified bilateral breast implants. Findings suggestive of intracapsular rupture noted. UPPER ABDOMEN: Limited images of the upper abdomen demonstrates no acute abnormality. Enlarged superior left retroperitoneal lymph node measuring up to 1.6 cm (axial 145). IMPRESSION: 1. Large right pleural effusion with complete collapse of the right middle and right lower lobes, near complete collapse of the right upper lobe, and significant mass effect and rightward shift of the cardiomediastinal structures. Underlying pulmonary neoplasm or hilar neoplasm is difficult to exclude given the lack of intravenous contrast. 2. Enlarged left lower neck lymph nodes with stippled calcification causing significant mass effect, rightward shift, and severe narrowing of the trachea at the thoracic inlet. This is consistent with metastatic disease. Clinical correlation for patient's respiratory status requested. 3. Extensive lymphadenopathy involving the mediastinum, retrocrural region, and superior left retroperitoneum, some with stippled calcification, consistent with either metastatic lymphadenopathy or underlying lymphoma. Electronically signed by: Rogelia Myers MD 05/28/2024 06:54 PM EST RP Workstation: [id number]   DG Chest Port 1 View Result Date: 05/28/2024 CLINICAL DATA:  Shortness of breath and cough EXAM: PORTABLE CHEST 1 VIEW COMPARISON:  05/28/2024 FINDINGS: Large right-sided pleural effusion, increased compared to prior. Underlying airspace  disease. Stable cardiomediastinal silhouette with aortic atherosclerosis. No pneumothorax. Bilateral breast implants. IMPRESSION: Large right-sided pleural effusion, increased compared to prior. Underlying pneumonia or mass not excluded. Electronically Signed   By: Luke Bun M.D.   On: 05/28/2024 17:56   DG Chest 2 View Result Date: 05/26/2024 CLINICAL DATA:  Cough for 4 weeks, right chest pain. EXAM: CHEST - 2 VIEW COMPARISON:  None Available. FINDINGS: Trachea is midline. Heart is at the upper limits of normal in size. Large right pleural effusion with collapse/consolidation in the right lung base. Left lung is clear. Degenerative changes in the spine. IMPRESSION: Large right pleural effusion with collapse/consolidation in the right lung base. Findings may be due to pneumonia with a parapneumonic effusion. Empyema cannot be excluded, nor can underlying malignancy. Followup PA and lateral chest X-ray is recommended in 3-4 weeks following trial of antibiotic therapy to ensure resolution and exclude underlying malignancy. Electronically Signed   By: Newell Eke M.D.   On: 05/26/2024 10:38   (Echo, Carotid, EGD, Colonoscopy, ERCP)    Subjective:  Anxious to go home Discharge Exam: Vitals:   06/03/24 0407 06/03/24 0729  BP: 105/78 116/70  Pulse: 89 91  Resp: 18 16  Temp: 98.4 F (36.9 C) 98.1 F (36.7 C)  SpO2: 96% 95%   Vitals:   06/02/24 1405 06/02/24 2035 06/03/24 0407 06/03/24 0729  BP: 123/70 123/83 105/78 116/70  Pulse: 97 95 89 91  Resp: 17 18 18 16   Temp: 98.1 F (36.7 C) 98.3 F (36.8 C) 98.4 F (36.9 C) 98.1 F (36.7  C)  TempSrc: Oral Oral Oral Oral  SpO2: 94% 96% 96% 95%  Weight:      Height:        General: Pt is alert, awake, not in acute distress Cardiovascular: RRR, S1/S2 +, no rubs, no gallops Respiratory: CTA bilaterally, no wheezing, no rhonchi Abdominal: Soft, NT, ND, bowel sounds + Extremities: no edema, no cyanosis    The results of significant  diagnostics from this hospitalization (including imaging, microbiology, ancillary and laboratory) are listed below for reference.     Microbiology: Recent Results (from the past 240 hours)  Culture, blood (Routine x 2)     Status: None   Collection Time: 05/28/24  4:35 PM   Specimen: BLOOD RIGHT FOREARM  Result Value Ref Range Status   Specimen Description   Final    BLOOD RIGHT FOREARM Performed at [facility], [address] [place], [place] [address]    Special Requests   Final    BOTTLES DRAWN AEROBIC AND ANAEROBIC Blood Culture adequate volume Performed at [facility], [address] [place], [place] [address]    Culture   Final    NO GROWTH 5 DAYS Performed at [facility], [address], [place], [place] [address]    Report Status 06/02/2024 FINAL  Final  Resp panel by RT-PCR (RSV, Flu A&B, Covid) Anterior Nasal Swab     Status: None   Collection Time: 05/28/24  5:08 PM   Specimen: Anterior Nasal Swab  Result Value Ref Range Status   SARS Coronavirus 2 by RT PCR NEGATIVE NEGATIVE Final    Comment: (NOTE) SARS-CoV-2 target nucleic acids are NOT DETECTED.  The SARS-CoV-2 RNA is generally detectable in upper respiratory specimens during the acute phase of infection. The lowest concentration of SARS-CoV-2 viral copies this assay can detect is 138 copies/mL. A negative result does not preclude SARS-Cov-2 infection and should not be used as the sole basis for treatment or other patient management decisions. A negative result may occur with  improper specimen collection/handling, submission of specimen other than nasopharyngeal swab, presence of viral mutation(s) within the areas targeted by this assay, and inadequate number of viral copies(<138 copies/mL). A negative result must be combined with clinical observations, patient history, and epidemiological information. The expected result is Negative.  Fact Sheet for Patients:   bloggercourse.com  Fact Sheet for Healthcare Providers:  seriousbroker.it  This test is no t yet approved or cleared by the [place]  FDA and  has been authorized for detection and/or diagnosis of SARS-CoV-2 by FDA under an Emergency Use Authorization (EUA). This EUA will remain  in effect (meaning this test can be used) for the duration of the COVID-19 declaration under Section 564(b)(1) of the Act, 21 U.S.C.section 360bbb-3(b)(1), unless the authorization is terminated  or revoked sooner.       Influenza A by PCR NEGATIVE NEGATIVE Final   Influenza B by PCR NEGATIVE NEGATIVE Final    Comment: (NOTE) The Xpert Xpress SARS-CoV-2/FLU/RSV plus assay is intended as an aid in the diagnosis of influenza from Nasopharyngeal swab specimens and should not be used as a sole basis for treatment. Nasal washings and aspirates are unacceptable for Xpert Xpress SARS-CoV-2/FLU/RSV testing.  Fact Sheet for Patients: bloggercourse.com  Fact Sheet for Healthcare Providers: seriousbroker.it  This test is not yet approved or cleared by the [place]  FDA and has been authorized for detection and/or diagnosis of SARS-CoV-2 by FDA under an Emergency Use Authorization (EUA). This  EUA will remain in effect (meaning this test can be used) for the duration of the COVID-19 declaration under Section 564(b)(1) of the Act, 21 U.S.C. section 360bbb-3(b)(1), unless the authorization is terminated or revoked.     Resp Syncytial Virus by PCR NEGATIVE NEGATIVE Final    Comment: (NOTE) Fact Sheet for Patients: bloggercourse.com  Fact Sheet for Healthcare Providers: seriousbroker.it  This test is not yet approved or cleared by the [place]  FDA and has been authorized for detection and/or diagnosis of SARS-CoV-2 by FDA under an Emergency Use  Authorization (EUA). This EUA will remain in effect (meaning this test can be used) for the duration of the COVID-19 declaration under Section 564(b)(1) of the Act, 21 U.S.C. section 360bbb-3(b)(1), unless the authorization is terminated or revoked.  Performed at [facility], [address] [place], [place] [address]   Culture, blood (Routine x 2)     Status: None   Collection Time: 05/29/24 12:26 AM   Specimen: BLOOD LEFT ARM  Result Value Ref Range Status   Specimen Description BLOOD LEFT ARM  Final   Special Requests   Final    BOTTLES DRAWN AEROBIC ONLY Blood Culture adequate volume   Culture   Final    NO GROWTH 5 DAYS Performed at [facility], [address], [place], [place] [address]    Report Status 06/03/2024 FINAL  Final  Body fluid culture w Gram Stain     Status: None   Collection Time: 05/29/24 11:13 AM   Specimen: Pleura; Body Fluid  Result Value Ref Range Status   Specimen Description   Final    PLEURAL Performed at [facility], [address] [place], [place] [address]    Special Requests   Final    NONE Performed at [facility], [address] [place], [place] [address]    Gram Stain   Final    RARE WBC PRESENT, PREDOMINANTLY MONONUCLEAR NO ORGANISMS SEEN    Culture   Final    NO GROWTH 3 DAYS Performed at [facility], [address], [place], [place] [address]    Report Status 06/02/2024 FINAL  Final  Culture, fungus without smear     Status: None (Preliminary result)   Collection Time: 05/29/24 11:13 AM   Specimen: Pleura; Other  Result Value Ref Range Status   Specimen Description   Final    PLEURAL Performed at [facility], [address], [place], [place] [address]    Special Requests   Final    NONE Performed at [facility], [address] [place], [place] [address]    Culture   Final    NO FUNGUS ISOLATED AFTER 5 DAYS Performed  at [facility], [address], [place], [place] [address]    Report Status PENDING  Incomplete  Acid Fast Smear (AFB)     Status: None   Collection Time: 05/29/24 11:13 AM   Specimen: Pleural  Result Value Ref Range Status   AFB Specimen Processing Concentration  Final   Acid Fast Smear Negative  Final    Comment: (NOTE) Performed At: [facility] [address] [place], [place] [id number] Jennette Shorter MD Ey:[id number]    Source (AFB) [phone]  Final    Comment: Performed at [facility], [address] [place], [place] [address]     Labs: BNP (last 3 results) No results for input(s): BNP in  the last 8760 hours. Basic Metabolic Panel: Recent Labs  Lab 05/28/24 1629 05/29/24 0026 05/29/24 0340 05/30/24 0355 05/31/24 1159 06/02/24 0345  NA 132*  --  133* 129* 135 129*  K 4.7  --  4.1 4.4 4.1 4.4  CL 96*  --  98 95* 99 96*  CO2 26  --  27 27 30 25   GLUCOSE 110*  --  135* 103* 124* 98  BUN 18  --  15 12 15 11   CREATININE 0.61 0.56 0.61 0.63 0.55 0.54  CALCIUM 9.3  --  8.8* 8.9 8.9 8.6*   Liver Function Tests: Recent Labs  Lab 05/28/24 1629 05/29/24 0340 05/30/24 0355 06/02/24 0345  AST 32 27 23 39  ALT 14 11 8 14   ALKPHOS 157* 133* 124 153*  BILITOT 0.2 0.2 0.2 0.3  PROT 8.8* 7.4 7.3 7.4  ALBUMIN 3.1* 2.6* 2.6* 2.4*   No results for input(s): LIPASE, AMYLASE in the last 168 hours. No results for input(s): AMMONIA in the last 168 hours. CBC: Recent Labs  Lab 05/28/24 1629 05/29/24 0026 05/29/24 0340 06/02/24 0345  WBC 10.5 10.4 10.2 14.4*  NEUTROABS 6.4  --   --   --   HGB 12.6 11.8* 10.9* 10.6*  HCT 39.2 36.8 34.0* 32.7*  MCV 83.2 84.2 83.5 83.0  PLT 598* 557* 531* 574*   Cardiac Enzymes: No results for input(s): CKTOTAL, CKMB, CKMBINDEX, TROPONINI in the last 168 hours. BNP: Invalid input(s): POCBNP CBG: No results for input(s): GLUCAP in the last 168 hours. D-Dimer No results for  input(s): DDIMER in the last 72 hours. Hgb A1c No results for input(s): HGBA1C in the last 72 hours. Lipid Profile No results for input(s): CHOL, HDL, LDLCALC, TRIG, CHOLHDL, LDLDIRECT in the last 72 hours. Thyroid function studies No results for input(s): TSH, T4TOTAL, T3FREE, THYROIDAB in the last 72 hours.  Invalid input(s): FREET3 Anemia work up No results for input(s): VITAMINB12, FOLATE, FERRITIN, TIBC, IRON, RETICCTPCT in the last 72 hours. Urinalysis    Component Value Date/Time   COLORURINE STRAW (A) 06/01/2024 0122   APPEARANCEUR CLEAR 06/01/2024 0122   LABSPEC 1.006 06/01/2024 0122   PHURINE 6.0 06/01/2024 0122   GLUCOSEU NEGATIVE 06/01/2024 0122   HGBUR MODERATE (A) 06/01/2024 0122   BILIRUBINUR NEGATIVE 06/01/2024 0122   KETONESUR NEGATIVE 06/01/2024 0122   PROTEINUR NEGATIVE 06/01/2024 0122   NITRITE NEGATIVE 06/01/2024 0122   LEUKOCYTESUR NEGATIVE 06/01/2024 0122   Sepsis Labs Recent Labs  Lab 05/28/24 1629 05/29/24 0026 05/29/24 0340 06/02/24 0345  WBC 10.5 10.4 10.2 14.4*   Microbiology Recent Results (from the past 240 hours)  Culture, blood (Routine x 2)     Status: None   Collection Time: 05/28/24  4:35 PM   Specimen: BLOOD RIGHT FOREARM  Result Value Ref Range Status   Specimen Description   Final    BLOOD RIGHT FOREARM Performed at [facility], [address] [place], [place] [address]    Special Requests   Final    BOTTLES DRAWN AEROBIC AND ANAEROBIC Blood Culture adequate volume Performed at [facility], [address] [place], [place] [address]    Culture   Final    NO GROWTH 5 DAYS Performed at [facility], [address] [place], [place] [address]    Report Status 06/02/2024 FINAL  Final  Resp panel by RT-PCR (RSV, Flu A&B, Covid) Anterior Nasal Swab     Status: None   Collection Time:  05/28/24  5:08 PM   Specimen: Anterior Nasal Swab  Result Value Ref Range Status    SARS Coronavirus 2 by RT PCR NEGATIVE NEGATIVE Final    Comment: (NOTE) SARS-CoV-2 target nucleic acids are NOT DETECTED.  The SARS-CoV-2 RNA is generally detectable in upper respiratory specimens during the acute phase of infection. The lowest concentration of SARS-CoV-2 viral copies this assay can detect is 138 copies/mL. A negative result does not preclude SARS-Cov-2 infection and should not be used as the sole basis for treatment or other patient management decisions. A negative result may occur with  improper specimen collection/handling, submission of specimen other than nasopharyngeal swab, presence of viral mutation(s) within the areas targeted by this assay, and inadequate number of viral copies(<138 copies/mL). A negative result must be combined with clinical observations, patient history, and epidemiological information. The expected result is Negative.  Fact Sheet for Patients:  bloggercourse.com  Fact Sheet for Healthcare Providers:  seriousbroker.it  This test is no t yet approved or cleared by the [place]  FDA and  has been authorized for detection and/or diagnosis of SARS-CoV-2 by FDA under an Emergency Use Authorization (EUA). This EUA will remain  in effect (meaning this test can be used) for the duration of the COVID-19 declaration under Section 564(b)(1) of the Act, 21 U.S.C.section 360bbb-3(b)(1), unless the authorization is terminated  or revoked sooner.       Influenza A by PCR NEGATIVE NEGATIVE Final   Influenza B by PCR NEGATIVE NEGATIVE Final    Comment: (NOTE) The Xpert Xpress SARS-CoV-2/FLU/RSV plus assay is intended as an aid in the diagnosis of influenza from Nasopharyngeal swab specimens and should not be used as a sole basis for treatment. Nasal washings and aspirates are unacceptable for Xpert Xpress SARS-CoV-2/FLU/RSV testing.  Fact Sheet for  Patients: bloggercourse.com  Fact Sheet for Healthcare Providers: seriousbroker.it  This test is not yet approved or cleared by the [place]  FDA and has been authorized for detection and/or diagnosis of SARS-CoV-2 by FDA under an Emergency Use Authorization (EUA). This EUA will remain in effect (meaning this test can be used) for the duration of the COVID-19 declaration under Section 564(b)(1) of the Act, 21 U.S.C. section 360bbb-3(b)(1), unless the authorization is terminated or revoked.     Resp Syncytial Virus by PCR NEGATIVE NEGATIVE Final    Comment: (NOTE) Fact Sheet for Patients: bloggercourse.com  Fact Sheet for Healthcare Providers: seriousbroker.it  This test is not yet approved or cleared by the [place]  FDA and has been authorized for detection and/or diagnosis of SARS-CoV-2 by FDA under an Emergency Use Authorization (EUA). This EUA will remain in effect (meaning this test can be used) for the duration of the COVID-19 declaration under Section 564(b)(1) of the Act, 21 U.S.C. section 360bbb-3(b)(1), unless the authorization is terminated or revoked.  Performed at [facility], [address] [place], [place] [address]   Culture, blood (Routine x 2)     Status: None   Collection Time: 05/29/24 12:26 AM   Specimen: BLOOD LEFT ARM  Result Value Ref Range Status   Specimen Description BLOOD LEFT ARM  Final   Special Requests   Final    BOTTLES DRAWN AEROBIC ONLY Blood Culture adequate volume   Culture   Final    NO GROWTH 5 DAYS Performed at [facility], [address], [place], [place] [address]    Report Status 06/03/2024 FINAL  Final  Body fluid culture w Gram Stain  Status: None   Collection Time: 05/29/24 11:13 AM   Specimen: Pleura; Body Fluid  Result Value Ref Range Status   Specimen Description   Final     PLEURAL Performed at [facility], [address] [place], [place] [address]    Special Requests   Final    NONE Performed at [facility], [address] [place], [place] [address]    Gram Stain   Final    RARE WBC PRESENT, PREDOMINANTLY MONONUCLEAR NO ORGANISMS SEEN    Culture   Final    NO GROWTH 3 DAYS Performed at [facility], [address], [place], [place] [address]    Report Status 06/02/2024 FINAL  Final  Culture, fungus without smear     Status: None (Preliminary result)   Collection Time: 05/29/24 11:13 AM   Specimen: Pleura; Other  Result Value Ref Range Status   Specimen Description   Final    PLEURAL Performed at [facility], [address], [place], [place] [address]    Special Requests   Final    NONE Performed at [facility], [address] [place], [place] [address]    Culture   Final    NO FUNGUS ISOLATED AFTER 5 DAYS Performed at [facility], [address], [place], [place] [address]    Report Status PENDING  Incomplete  Acid Fast Smear (AFB)     Status: None   Collection Time: 05/29/24 11:13 AM   Specimen: Pleural  Result Value Ref Range Status   AFB Specimen Processing Concentration  Final   Acid Fast Smear Negative  Final    Comment: (NOTE) Performed At: [facility] [address] [place], [place] [id number] Jennette Shorter MD Ey:[id number]    Source (AFB) [phone]  Final    Comment: Performed at [facility], [address] [place], [place] [address]     Time coordinating discharge: 40 min SIGNED:   Almarie KANDICE Hoots, MD  [facility] 06/03/2024, 3:12 PM      [1]  Allergies Allergen Reactions   Codeine Itching

## 2024-06-03 NOTE — Progress Notes (Signed)
 Physical Therapy Treatment Patient Details Name: Jodi Cameron MRN: [id number] DOB: 19-Oct-1955 Today's Date: 06/03/2024   History of Present Illness Jodi Cameron is a 69 y.o. female presents with dyspnea. 1/16 chest CT Large right pleural effusion with complete collapse of the right middle and right lower lobes, near complete collapse of the right upper lobe, and significant mass effect and rightward shift of the cardiomediastinal structures. 1/17 Chest tube placed and CT abd/pelvis 1.Large, lobulated pelvic mass inseparable from the uterus measuring 14.7 cm,  compatible with uterine malignancy.  2. Pelvic, retroperitoneal, and retrocrural nodal metastases.  3. Peritoneal disease, as above, with small volume pelvic ascites.  4. Moderate right pleural effusion with indwelling pleural drain.  PMH: tobacco abuse    PT Comments  Pt dressed and eager to D/C to home at [facility].  Pt tolerated amb a full unit with NO AD and no major complaints.  Pt is a agreeable to [facility] PT.  No equipment needs. Will update LPT   If plan is discharge home, recommend the following:     Can travel by private vehicle        Equipment Recommendations       Recommendations for Other Services       Precautions / Restrictions Precautions Precautions: None     Mobility  Bed Mobility               General bed mobility comments: IND    Transfers Overall transfer level: Modified independent Equipment used: None               General transfer comment: appears at prior level    Ambulation/Gait Ambulation/Gait assistance: Modified independent (Device/Increase time) Gait Distance (Feet): 250 Feet Assistive device: None Gait Pattern/deviations: Step-through pattern       General Gait Details: tolerated amb in hallway   Stairs             Wheelchair Mobility     Tilt Bed    Modified Rankin (Stroke Patients Only)       Balance                                             Communication Communication Communication: No apparent difficulties  Cognition Arousal: Alert Behavior During Therapy: WFL for tasks assessed/performed   PT - Cognitive impairments: No apparent impairments                       PT - Cognition Comments: Axo x 3 pleasant and motivated.  Eager to go home. Following commands: Intact      Cueing    Exercises      General Comments        Pertinent Vitals/Pain Pain Assessment Pain Assessment: No/denies pain    Home Living                          Prior Function            PT Goals (current goals can now be found in the care plan section) Progress towards PT goals: Progressing toward goals    Frequency    Min 2X/week      PT Plan      Co-evaluation              AM-PAC PT 6 Clicks Mobility   Outcome  Measure                   End of Session Equipment Utilized During Treatment: Gait belt   Patient left: in bed Nurse Communication: Mobility status PT Visit Diagnosis: Unsteadiness on feet (R26.81)     Time: 8966-8955 PT Time Calculation (min) (ACUTE ONLY): 11 min  Charges:    $Gait Training: 8-22 mins PT General Charges $$ ACUTE PT VISIT: 1 Visit                     Katheryn Leap  PTA Acute  Rehabilitation Services Office M-F          [phone]

## 2024-06-03 NOTE — Telephone Encounter (Signed)
 Jodi Cameron called back and confirmed the appointments.  She is aware scheduling will call her with the chemo appointment on 06/14/24.  Also emailed her the cancer center's address.

## 2024-06-03 NOTE — Telephone Encounter (Signed)
 Left a message with appointment to see Dr. Lonn on 06/11/24 at 2:00 with arrival to check in at 1:30 followed by labs and Patient Education class at 4:00.  Asked for a call back if she has any questions.

## 2024-06-03 NOTE — Discharge Instructions (Addendum)
 Dr. Viktoria recommends staying on a bowel regimen. This can include miralax  at least once daily. If you need to increase to twice daily, you can do that. Also if you need to add in sennakot-S, this is another option as well.   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 Based on the imaging and pathology findings, the stage of the cancer would be Stage IV (four). Given the extent of disease (the places it has spread), surgery is not recommended at this time since we would not be able to remove all the cancer. The recommendation would be for chemotherapy which would go through your blood stream to treat the cancer in several places. Dr. Viktoria recommended having a port-a-cath placed which is a device that goes under your skin in the upper chest that goes directly into a major blood vessel to make giving chemotherapy safer.  The chemotherapy drugs recommended by Dr. Viktoria include carboplatin, taxol, and possible avastin. Dr. Lonn, Medical Oncologist who gives chemotherapy, will discuss this more in detail with you. We will monitor your progress on chemotherapy by scans (typically after 3-4 cycles) and monitoring the CA 125 tumor marker that was elevated. You may be evaluated for surgery in the future if you are having a good response to chemotherapy and the cancer shrinks. We would also recommend genetic testing.    If you decide against treatment, Dr. Viktoria said you would be looking at weeks to months in regards to life expectancy. If you decide to proceed with treatment, you could be looking at years. After 5 years, 50% of ovarian cancer patients will still be alive.   From Dr. Lewie consultation in the hospital: We discussed that the treatment approach for ovarian cancer, which is typically combination of cytoreductive surgery and  chemotherapy. I discussed that sequencing of this can be either with upfront debulking followed by adjuvant chemotherapy sequentially or neoadjuvant chemotherapy followed by an interval cytoreductive attempt, then additional chemotherapy. This latter approach is associated with a reduced perioperative morbidity at the time of surgery.  I discussed that decisions regarding sequencing of therapy is individualized taking into account individual patient health, in addition to the apparent tumor distribution on imaging, and likelihood of complete surgical resection at the time of surgery.  Given her large burden of disease as well as her extreme abdominal disease, we discussed that my recommendation is to start with neoadjuvant chemotherapy using a platinum doublet.  We reviewed the 2 medications that are typically used and that chemotherapy is given through a port.  I would recommend the addition of a Avastin given the improved OS in the high risk subgroup in ICON 7 (which included those with stage IV disease).   We discussed the importance of both germline and somatic testing.  The patient will be referred to genetics for germline testing.  We will perform somatic testing either on her recent biopsy or at the time of her future surgery.

## 2024-06-04 ENCOUNTER — Telehealth: Payer: Self-pay | Admitting: Oncology

## 2024-06-04 ENCOUNTER — Other Ambulatory Visit: Payer: Self-pay

## 2024-06-04 ENCOUNTER — Encounter: Payer: Self-pay | Admitting: Oncology

## 2024-06-04 NOTE — Telephone Encounter (Signed)
 Rock called and asked about FMLA for her family members.  Advised her they will need to request the paperwork from their HR departments. They can give the paperwork to us  when they are here for appointments or can fax or email.  Sent the contact information in a MyChart message.  Also scheduled genetic counseling appointment for 06/17/24 at 8:00.  She verbalized understanding and agreement.

## 2024-06-07 ENCOUNTER — Telehealth: Payer: Self-pay | Admitting: Oncology

## 2024-06-07 ENCOUNTER — Encounter: Payer: Self-pay | Admitting: Student

## 2024-06-07 ENCOUNTER — Other Ambulatory Visit: Payer: Self-pay | Admitting: Oncology

## 2024-06-07 LAB — MISC LABCORP TEST (SEND OUT)
LabCorp test name: 5367
Labcorp test code: 9985

## 2024-06-07 MED ORDER — HYDROCODONE-ACETAMINOPHEN 5-325 MG PO TABS: 1.0000 | ORAL_TABLET | Freq: Four times a day (QID) | ORAL | 0 refills | Status: AC | PRN

## 2024-06-07 NOTE — Telephone Encounter (Signed)
 Called Marissa back with FMLA contact information.

## 2024-06-07 NOTE — Addendum Note (Signed)
 Addended by: WHEELER HARLENE CROME on: 06/07/2024 12:13 PM   Modules accepted: Orders

## 2024-06-07 NOTE — Progress Notes (Signed)
 Gynecologic Oncology Multi-Disciplinary Disposition Conference Note  Date of the Conference: 06/07/2024  Patient Name: Jodi Cameron  Primary GYN Oncologist: Dr. 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 Disposition is to neoadjuvant chemotherapy plus bevacizumab after 1-2 cycles.   This Multidisciplinary conference took place involving physicians from Gynecologic Oncology, Medical Oncology, Radiation Oncology, Pathology, Radiology along with the Gynecologic Oncology Nurse Practitioner and Gynecologic Oncology Nurse Navigator.  Comprehensive assessment of the patient's malignancy, staging, need for surgery, chemotherapy, radiation therapy, and need for further testing were reviewed. Supportive measures, both inpatient and following discharge were also discussed. The recommended plan of care is documented. Greater than 35 minutes were spent correlating and coordinating this patient's care.

## 2024-06-08 ENCOUNTER — Encounter: Payer: Self-pay | Admitting: Hematology and Oncology

## 2024-06-08 ENCOUNTER — Encounter: Payer: Self-pay | Admitting: Oncology

## 2024-06-08 NOTE — Progress Notes (Signed)
 Order requisition sent to [facility] on accession [id number] per Dr. Viktoria.

## 2024-06-09 ENCOUNTER — Encounter: Payer: Self-pay | Admitting: *Deleted

## 2024-06-09 ENCOUNTER — Encounter: Payer: Self-pay | Admitting: Hematology and Oncology

## 2024-06-11 ENCOUNTER — Inpatient Hospital Stay

## 2024-06-11 ENCOUNTER — Inpatient Hospital Stay: Attending: Hematology and Oncology | Admitting: Hematology and Oncology

## 2024-06-11 VITALS — BP 110/72 | HR 97 | Temp 98.1°F | Resp 18 | Ht 66.0 in | Wt 140.8 lb

## 2024-06-11 DIAGNOSIS — J90 Pleural effusion, not elsewhere classified: Secondary | ICD-10-CM | POA: Diagnosis not present

## 2024-06-11 DIAGNOSIS — D638 Anemia in other chronic diseases classified elsewhere: Secondary | ICD-10-CM | POA: Insufficient documentation

## 2024-06-11 DIAGNOSIS — C563 Malignant neoplasm of bilateral ovaries: Secondary | ICD-10-CM | POA: Insufficient documentation

## 2024-06-11 DIAGNOSIS — K5909 Other constipation: Secondary | ICD-10-CM | POA: Diagnosis not present

## 2024-06-11 DIAGNOSIS — D75838 Other thrombocytosis: Secondary | ICD-10-CM | POA: Diagnosis not present

## 2024-06-11 LAB — CBC WITH DIFFERENTIAL (CANCER CENTER ONLY)
Abs Immature Granulocytes: 0.03 10*3/uL (ref 0.00–0.07)
Basophils Absolute: 0.1 10*3/uL (ref 0.0–0.1)
Basophils Relative: 1 %
Eosinophils Absolute: 0.2 10*3/uL (ref 0.0–0.5)
Eosinophils Relative: 2 %
HCT: 34.5 % — ABNORMAL LOW (ref 36.0–46.0)
Hemoglobin: 10.8 g/dL — ABNORMAL LOW (ref 12.0–15.0)
Immature Granulocytes: 0 %
Lymphocytes Relative: 32 %
Lymphs Abs: 2.4 10*3/uL (ref 0.7–4.0)
MCH: 26 pg (ref 26.0–34.0)
MCHC: 31.3 g/dL (ref 30.0–36.0)
MCV: 82.9 fL (ref 80.0–100.0)
Monocytes Absolute: 0.9 10*3/uL (ref 0.1–1.0)
Monocytes Relative: 11 %
Neutro Abs: 4 10*3/uL (ref 1.7–7.7)
Neutrophils Relative %: 54 %
Platelet Count: 772 10*3/uL — ABNORMAL HIGH (ref 150–400)
RBC: 4.16 MIL/uL (ref 3.87–5.11)
RDW: 14 % (ref 11.5–15.5)
WBC Count: 7.5 10*3/uL (ref 4.0–10.5)
nRBC: 0 % (ref 0.0–0.2)

## 2024-06-11 LAB — CMP (CANCER CENTER ONLY)
ALT: 15 U/L (ref 0–44)
AST: 35 U/L (ref 15–41)
Albumin: 3.1 g/dL — ABNORMAL LOW (ref 3.5–5.0)
Alkaline Phosphatase: 142 U/L — ABNORMAL HIGH (ref 38–126)
Anion gap: 11 (ref 5–15)
BUN: 19 mg/dL (ref 8–23)
CO2: 26 mmol/L (ref 22–32)
Calcium: 9.2 mg/dL (ref 8.9–10.3)
Chloride: 100 mmol/L (ref 98–111)
Creatinine: 0.65 mg/dL (ref 0.44–1.00)
GFR, Estimated: >60 mL/min
Glucose, Bld: 98 mg/dL (ref 70–99)
Potassium: 4.3 mmol/L (ref 3.5–5.1)
Sodium: 137 mmol/L (ref 135–145)
Total Bilirubin: 0.2 mg/dL (ref 0.0–1.2)
Total Protein: 8.8 g/dL — ABNORMAL HIGH (ref 6.5–8.1)

## 2024-06-11 MED ORDER — PROCHLORPERAZINE MALEATE 10 MG PO TABS: 10.0000 mg | ORAL_TABLET | Freq: Four times a day (QID) | ORAL | 1 refills | Status: AC | PRN

## 2024-06-11 MED ORDER — DEXAMETHASONE 4 MG PO TABS: ORAL_TABLET | ORAL | 6 refills | Status: AC

## 2024-06-11 NOTE — Assessment & Plan Note (Addendum)
 Due to untreated cancer Will monitor closely

## 2024-06-11 NOTE — Assessment & Plan Note (Addendum)
 Monitor closely while on

## 2024-06-11 NOTE — Assessment & Plan Note (Addendum)
 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 The intent is of curative intent.  We discussed some of the risks, benefits, side-effects of carboplatin  & Taxol . Treatment is intravenous, every 3 weeks x 6 cycles  Some of the short term side-effects included, though not limited to, including weight loss, life threatening infections, risk of allergic reactions, need for transfusions of blood products, nausea, vomiting, change in bowel habits, loss of hair, admission to hospital for various reasons, and risks of death.   Long term side-effects are also discussed including risks of infertility, permanent damage to nerve function, hearing loss, chronic fatigue, kidney damage with possibility needing hemodialysis, and rare secondary malignancy including bone marrow disorders.  The patient is aware that the response rates discussed earlier is not guaranteed.  After a long discussion, patient made an informed decision to proceed with the prescribed plan of care.   Patient education material was dispensed. We discussed premedication with dexamethasone  before chemotherapy. She would  qualify for bevacizumab to be added to her treatment but due to anticipated risk of invasive procedure, I will hold off bevacizumab for cycle 1 If she has no further need for therapeutic thoracentesis, I might add bevacizumab for cycle 2 and beyond

## 2024-06-11 NOTE — Assessment & Plan Note (Addendum)
 She has persistent pleural effusion on exam but not symptomatic Monitor closely She will continue incentive spirometry

## 2024-06-11 NOTE — Assessment & Plan Note (Addendum)
 We discussed importance of adequate and regular laxatives

## 2024-06-11 NOTE — Progress Notes (Signed)
 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 The intent is of curative intent.  We discussed some of the risks, benefits, side-effects of carboplatin  & Taxol . Treatment is intravenous, every 3 weeks x 6 cycles  Some of the short term side-effects included, though not limited to, including weight loss, life threatening infections, risk of allergic reactions, need for transfusions of blood products, nausea, vomiting, change in bowel habits, loss of hair, admission to hospital for various reasons, and risks of death.   Long term side-effects are also discussed including risks of infertility, permanent damage to nerve function, hearing loss, chronic fatigue, kidney damage with possibility needing hemodialysis, and rare secondary malignancy including bone marrow disorders.  The patient is aware that the response rates discussed earlier is not guaranteed.  After a long discussion, patient made  an informed decision to proceed with the prescribed plan of care.   Patient education material was dispensed. We discussed premedication with dexamethasone  before chemotherapy. She would qualify for bevacizumab to be added to her treatment but due to anticipated risk of invasive procedure, I will hold off bevacizumab for cycle 1 If she has no further need for therapeutic thoracentesis, I might add bevacizumab for cycle 2 and beyond  Pleural effusion on right She has persistent pleural effusion on exam but not symptomatic Monitor closely She will continue incentive spirometry Anemia due to chronic illness Monitor closely while on Other constipation We discussed importance of adequate and regular laxatives Reactive thrombocytosis Due to untreated cancer Will monitor closely  Orders Placed This Encounter  Procedures   MR Brain W Wo Contrast    Standing Status:   Future    Expected Date:   06/18/2024    Expiration Date:   06/11/2025    If indicated for the ordered procedure, I authorize the administration of contrast media per Radiology protocol:   Yes    What is the patient's sedation requirement?:   No Sedation    Does the patient have a pacemaker or implanted devices?:   No    Use SRS Protocol?:   No    Preferred imaging location?:   [facility] (table limit - 500lbs)   [place] [address]    Standing Status:   Standing    Number of Occurrences:   11    Expiration Date:   06/11/2025     Almarie Bedford, MD  INTERVAL HISTORY: she returns for surveillance follow-up after recent hospital discharge Please see my dictation note from January 27 for further details She is here accompanied by 2 daughters, Brittany  and Marissa and daughter-in-law, Jorja I reviewed imaging study, pathology report and we discussed plan of care extensively Overall, spent 90 minutes with patient and family  PHYSICAL EXAMINATION: ECOG PERFORMANCE STATUS: 1 - Symptomatic but completely ambulatory  Vitals:    06/11/24 1339  BP: 110/72  Pulse: 97  Resp: 18  Temp: 98.1 F (36.7 C)  SpO2: 99%   Filed Weights   06/11/24 1339  Weight: 140 lb 12.8 oz (63.9 kg)   Limited examination of the lungs revealed persistent right sided pleural effusion Relevant data reviewed during this visit included CBC, CMP, CT imaging, pathology report

## 2024-06-13 ENCOUNTER — Telehealth: Payer: Self-pay | Admitting: Hematology and Oncology

## 2024-06-13 NOTE — Telephone Encounter (Signed)
 Rescheduled appointments due to the CC being closed on Monday 2/2. Called and left VM with the changes made to the patients upcoming appointments.

## 2024-06-14 ENCOUNTER — Inpatient Hospital Stay

## 2024-06-14 NOTE — Progress Notes (Signed)
 Pharmacist Chemotherapy Monitoring - Initial Assessment    Anticipated start date: 06/15/24   The following has been reviewed per standard work regarding the patient's treatment regimen: The patient's diagnosis, treatment plan and drug doses, and organ/hematologic function Lab orders and baseline tests specific to treatment regimen  The treatment plan start date, drug sequencing, and pre-medications Prior authorization status  Patient's documented medication list, including drug-drug interaction screen and prescriptions for anti-emetics and supportive care specific to the treatment regimen The drug concentrations, fluid compatibility, administration routes, and timing of the medications to be used The patient's access for treatment and lifetime cumulative dose history, if applicable  The patient's medication allergies and previous infusion related reactions, if applicable   Changes made to treatment plan:  N/A  Follow up needed:  N/A  Harlene JONELLE Nasuti, RPH, 06/14/2024  2:58 PM

## 2024-06-15 ENCOUNTER — Inpatient Hospital Stay: Admitting: Licensed Clinical Social Worker

## 2024-06-15 ENCOUNTER — Telehealth: Payer: Self-pay

## 2024-06-15 ENCOUNTER — Inpatient Hospital Stay

## 2024-06-15 VITALS — BP 116/79 | HR 84 | Temp 98.0°F | Resp 18 | Wt 132.8 lb

## 2024-06-15 DIAGNOSIS — C563 Malignant neoplasm of bilateral ovaries: Secondary | ICD-10-CM

## 2024-06-15 MED ORDER — FAMOTIDINE IN NACL 20-0.9 MG/50ML-% IV SOLN
20.0000 mg | Freq: Once | INTRAVENOUS | Status: AC
  Administered 2024-06-15: 20 mg via INTRAVENOUS
  Filled 2024-06-15: qty 50

## 2024-06-15 MED ORDER — DEXAMETHASONE SOD PHOSPHATE PF 10 MG/ML IJ SOLN
10.0000 mg | Freq: Once | INTRAMUSCULAR | Status: AC
  Administered 2024-06-15: 10 mg via INTRAVENOUS
  Filled 2024-06-15: qty 1

## 2024-06-15 MED ORDER — DIPHENHYDRAMINE HCL 50 MG/ML IJ SOLN
25.0000 mg | Freq: Once | INTRAMUSCULAR | Status: AC
  Administered 2024-06-15: 25 mg via INTRAVENOUS
  Filled 2024-06-15: qty 1

## 2024-06-15 MED ORDER — PALONOSETRON HCL INJECTION 0.25 MG/5ML
0.2500 mg | Freq: Once | INTRAVENOUS | Status: AC
  Administered 2024-06-15: 0.25 mg via INTRAVENOUS
  Filled 2024-06-15: qty 5

## 2024-06-15 MED ORDER — SODIUM CHLORIDE 0.9 % IV SOLN
175.0000 mg/m2 | Freq: Once | INTRAVENOUS | Status: AC
  Administered 2024-06-15: 300 mg via INTRAVENOUS
  Filled 2024-06-15: qty 50

## 2024-06-15 MED ORDER — SODIUM CHLORIDE 0.9 % IV SOLN: INTRAVENOUS | Status: DC

## 2024-06-15 MED ORDER — SODIUM CHLORIDE 0.9 % IV SOLN
483.0000 mg | Freq: Once | INTRAVENOUS | Status: AC
  Administered 2024-06-15: 480 mg via INTRAVENOUS
  Filled 2024-06-15: qty 48

## 2024-06-15 MED ORDER — APREPITANT 130 MG/18ML IV EMUL
130.0000 mg | Freq: Once | INTRAVENOUS | Status: AC
  Administered 2024-06-15: 130 mg via INTRAVENOUS
  Filled 2024-06-15: qty 18

## 2024-06-15 NOTE — Patient Instructions (Signed)
 [facility] - A DEPT OF [place]  Discharge Instructions: Thank you for choosing [facility] to provide your oncology and hematology care.   If you have a lab appointment with the Cancer Center, please go directly to the Cancer Center and check in at the registration area.   Wear comfortable clothing and clothing appropriate for easy access to any Portacath or PICC line.   We strive to give you quality time with your provider. You may need to reschedule your appointment if you arrive late (15 or more minutes).  Arriving late affects you and other patients whose appointments are after yours.  Also, if you miss three or more appointments without notifying the office, you may be dismissed from the clinic at the provider's discretion.      For prescription refill requests, have your pharmacy contact our office and allow 72 hours for refills to be completed.    Today you received the following chemotherapy and/or immunotherapy agents paclitaxel , carboplatin       To help prevent nausea and vomiting after your treatment, we encourage you to take your nausea medication as directed.  BELOW ARE SYMPTOMS THAT SHOULD BE REPORTED IMMEDIATELY: *FEVER GREATER THAN 100.4 F (38 C) OR HIGHER *CHILLS OR SWEATING *NAUSEA AND VOMITING THAT IS NOT CONTROLLED WITH YOUR NAUSEA MEDICATION *UNUSUAL SHORTNESS OF BREATH *UNUSUAL BRUISING OR BLEEDING *URINARY PROBLEMS (pain or burning when urinating, or frequent urination) *BOWEL PROBLEMS (unusual diarrhea, constipation, pain near the anus) TENDERNESS IN MOUTH AND THROAT WITH OR WITHOUT PRESENCE OF ULCERS (sore throat, sores in mouth, or a toothache) UNUSUAL RASH, SWELLING OR PAIN  UNUSUAL VAGINAL DISCHARGE OR ITCHING   Items with * indicate a potential emergency and should be followed up as soon as possible or go to the Emergency Department if any problems should occur.  Please show the CHEMOTHERAPY ALERT CARD or  IMMUNOTHERAPY ALERT CARD at check-in to the Emergency Department and triage nurse.  Should you have questions after your visit or need to cancel or reschedule your appointment, please contact [facility] - A DEPT OF [facility]  Dept: [phone]  and follow the prompts.  Office hours are 8:00 a.m. to 4:30 p.m. Monday - Friday. Please note that voicemails left after 4:00 p.m. may not be returned until the following business day.  We are closed weekends and major holidays. You have access to a nurse at all times for urgent questions. Please call the main number to the clinic Dept: [phone] and follow the prompts.   For any non-urgent questions, you may also contact your provider using MyChart. We now offer e-Visits for anyone 25 and older to request care online for non-urgent symptoms. For details visit [email or web address].   Also download the MyChart app! Go to the app store, search MyChart, open the app, select [place], and log in with your MyChart username and password.

## 2024-06-15 NOTE — Telephone Encounter (Signed)
 Pt and her daughter were here in office on 06/15/2024. Pt daughter was able to retrieve her FMLA forms for her records.

## 2024-06-15 NOTE — Research (Signed)
 [id number]- Effectiveness of Out-of Pocket Cost Communication and Financial Navigation (CostCOM) in Cancer Patients:     Patient Jodi Cameron was identified by Dr. Lonn as a potential candidate for the above listed study.  This Clinical Research Coordinator met with Jodi Cameron, [id number], on 06/15/24 in a manner and location that ensures patient privacy to discuss participation in the above listed research study.  Patient is Accompanied by daughter.  A copy of the informed consent document and separate HIPAA Authorization was provided to the patient.  Patient reads, speaks, and understands English.   Patient was provided with the business card of this Coordinator and encouraged to contact the research team with any questions.  Approximately 15 minutes were spent with the patient reviewing the informed consent documents.  Patient was provided the option of taking informed consent documents home to review and was encouraged to review at their convenience with their support network, including other care providers. Patient took the consent documents home to review.  Jodi Cameron, MPH  Clinical Research Coordinator

## 2024-06-15 NOTE — Progress Notes (Signed)
 [facility] Clinical Social Work  Initial Assessment   Jodi Cameron is a 69 y.o. year old female accompanied by daughter, Brittany. Clinical Social Work was referred by distress screen protocol for distress screen needs.   SDOH (Social Determinants of Health) assessments performed: Yes SDOH Interventions    Flowsheet Row Clinical Support from 06/15/2024 in [facility] - A Dept Of [place] Office Visit from 05/28/2024 in [facility] at [facility]  SDOH Interventions    Food Insecurity Interventions Intervention Not Indicated --  Housing Interventions Intervention Not Indicated --  Transportation Interventions Intervention Not Indicated --  Utilities Interventions Intervention Not Indicated --  Depression Interventions/Treatment  -- Counseling    SDOH Screenings   Food Insecurity: No Food Insecurity (06/15/2024)  Housing: Low Risk (06/15/2024)  Transportation Needs: No Transportation Needs (06/15/2024)  Utilities: Not At Risk (06/15/2024)  Depression (PHQ2-9): Low Risk (06/15/2024)  Recent Concern: Depression (PHQ2-9) - High Risk (05/28/2024)  Social Connections: Socially Isolated (05/28/2024)  Tobacco Use: Medium Risk (05/29/2024)    PHQ 2/9:    06/15/2024   10:00 AM 05/28/2024    3:34 PM 12/11/2022    3:08 PM  Depression screen PHQ 2/9  Decreased Interest 0 3 1  Down, Depressed, Hopeless 1 2 1   PHQ - 2 Score 1 5 2   Altered sleeping  2 0  Tired, decreased energy  3 1  Change in appetite  2 0  Feeling bad or failure about yourself   0 0  Trouble concentrating  1 0  Moving slowly or fidgety/restless  2 0  Suicidal thoughts  0 0  PHQ-9 Score  15 3      Data saved with a previous flowsheet row definition     Distress Screen completed: Yes    06/11/2024    5:35 PM  ONCBCN DISTRESS SCREENING  Screening Type Initial Screening  How much distress have you been experiencing in the past week? (0-10) 8  Emotional concerns type Worry  or anxiety;Sadness or depression  Physical Concerns Type  Pain;Sleep;Fatigue;Changes in eating;Loss or change of physical abilities      Family/Social Information:  Housing Arrangement: patient lives alone. Her kids have planned to each be with her during different treatments Family members/support persons in your life? Family- daughters (Brittany in [place] ; Marissa in [place] , Tish), son Darlyn locally; 3 grandkids (ages 69, 62, 5) Transportation concerns: no  Employment: Retired.  Financial concerns: No Type of concern: None Food access concerns: no Religious or spiritual practice: Not known Advanced directives: Yes-not on file Services Currently in place:  Medicare  Coping/ Adjustment to diagnosis: Patient understands treatment plan and what happens next? yes, she is starting treatment today. Plan for chemo. Pt is anxious and is ready to start today so that there is less unknown. Patient reported stressors: Anxiety/ nervousness, Adjusting to my illness, Facing my mortality, and impact on children Patient enjoys being outside, time with family/ friends, and hiking, her pets Current coping skills/ strengths: Ability for insight , Active sense of humor , Capable of independent living , Communication skills , Motivation for treatment/growth , Special hobby/interest , and Supportive family/friends     SUMMARY: Current SDOH Barriers:  Emotional adjustment to cancer diagnosis  Clinical Social Work Clinical Goal(s):  Patient will work with SW to address concerns related to adjustment to cancer  Interventions: Discussed common feeling and emotions when being diagnosed with cancer, and the importance of support during  treatment Informed patient of the support team roles and support services at [facility] Provided CSW contact information and encouraged patient to call with any questions or concerns Provided brief mental health counseling with regard to adjustment to cancer  Provided Journey  binder & [facility] bag   Follow Up Plan: Patient will contact CSW to schedule counseling when ready Patient verbalizes understanding of plan: Yes    Chariah Bailey E Ketsia Linebaugh, LCSW Clinical Social Worker [facility]

## 2024-06-15 NOTE — Telephone Encounter (Signed)
 SABRA

## 2024-06-16 ENCOUNTER — Encounter: Payer: Self-pay | Admitting: Hematology and Oncology

## 2024-06-17 ENCOUNTER — Inpatient Hospital Stay

## 2024-06-17 ENCOUNTER — Ambulatory Visit: Admitting: Pulmonary Disease

## 2024-06-17 ENCOUNTER — Ambulatory Visit

## 2024-06-17 ENCOUNTER — Other Ambulatory Visit: Payer: Self-pay | Admitting: Genetic Counselor

## 2024-06-17 ENCOUNTER — Telehealth: Payer: Self-pay

## 2024-06-17 ENCOUNTER — Encounter: Payer: Self-pay | Admitting: Genetic Counselor

## 2024-06-17 ENCOUNTER — Encounter: Payer: Self-pay | Admitting: Pulmonary Disease

## 2024-06-17 VITALS — BP 132/80 | HR 72 | Temp 96.8°F | Ht 67.0 in | Wt 137.0 lb

## 2024-06-17 DIAGNOSIS — C563 Malignant neoplasm of bilateral ovaries: Secondary | ICD-10-CM

## 2024-06-17 DIAGNOSIS — J90 Pleural effusion, not elsewhere classified: Secondary | ICD-10-CM

## 2024-06-17 DIAGNOSIS — Z8052 Family history of malignant neoplasm of bladder: Secondary | ICD-10-CM | POA: Insufficient documentation

## 2024-06-17 DIAGNOSIS — Z87891 Personal history of nicotine dependence: Secondary | ICD-10-CM

## 2024-06-17 DIAGNOSIS — Z803 Family history of malignant neoplasm of breast: Secondary | ICD-10-CM | POA: Insufficient documentation

## 2024-06-17 DIAGNOSIS — C569 Malignant neoplasm of unspecified ovary: Secondary | ICD-10-CM

## 2024-06-17 DIAGNOSIS — J910 Malignant pleural effusion: Secondary | ICD-10-CM

## 2024-06-17 LAB — GENETIC SCREENING ORDER

## 2024-06-17 NOTE — Telephone Encounter (Signed)
 Spoke with pt daughter  (Mrs.Knapp)in regards to her FMLA form being completed,faxed, and confirmation received.

## 2024-06-17 NOTE — Patient Instructions (Addendum)
" °  VISIT SUMMARY: Today, we evaluated your large right pleural effusion related to metastatic ovarian cancer. You are currently asymptomatic and continuing chemotherapy with carboplatin  and Taxol .  YOUR PLAN: MALIGNANT PLEURAL EFFUSION: Fluid accumulation in the lung lining due to metastatic ovarian cancer. -We have ordered a chest x-ray to assess the current status of your pleural effusion. -Continue your chemotherapy with carboplatin  and Taxol  as planned. -Monitor for any symptoms of increased shortness of breath or respiratory distress. -If the pleural effusion reaccumulates and becomes symptomatic, we will consider doing a drainage or placing a long-term pleurX catheter.    Contains text generated by [employer]bridge.   "

## 2024-06-17 NOTE — Progress Notes (Signed)
 REFERRING PROVIDER: Lonn Hicks, MD [address] [place],  [place] [address]  PRIMARY PROVIDER:  Wheeler Harlene CROME, NP  PRIMARY REASON FOR VISIT:  1. Family history of breast cancer   2. Family history of bladder cancer   3. Malignant neoplasm of both ovaries (HCC)      HISTORY OF PRESENT ILLNESS:   Ms. Claflin, a 69 y.o. female, was seen for a [place] cancer genetics consultation at the request of Dr. Lonn due to a personal and family history of cancer.  Ms. Jian presents to clinic today to discuss the possibility of a hereditary predisposition to cancer, genetic testing, and to further clarify her future cancer risks, as well as potential cancer risks for family members.   In January 2026, at the age of 38, Ms. Hadaway was diagnosed with cancer of the right and left ovary. The treatment plan includes chemotherapy and surgery.    CANCER HISTORY:  Oncology History  Malignant neoplasm of both ovaries (HCC)  05/28/2024 Imaging   CT CHEST WO CONTRAST Result Date: 06/02/2024 CLINICAL DATA:  Evaluate for pleural effusion. EXAM: CT CHEST WITHOUT CONTRAST TECHNIQUE: Multidetector CT imaging of the chest was performed following the standard protocol without IV contrast. RADIATION DOSE REDUCTION: This exam was performed according to the departmental dose-optimization program which includes automated exposure control, adjustment of the mA and/or kV according to patient size and/or use of iterative reconstruction technique. COMPARISON:  Chest CT dated 05/28/2024. FINDINGS: Evaluation of this exam is limited in the absence of intravenous contrast. Cardiovascular: There is no cardiomegaly. Small pericardial effusion. The thoracic aorta and the central pulmonary arteries are grossly unremarkable on this noncontrast CT. Mediastinum/Nodes: Similar appearance of mediastinal adenopathy measuring up to 18 mm short axis in the prevascular space. The esophagus is grossly unremarkable.  Partially calcified mass in the left lower neck measures 4.5 x 3.0 cm, likely adenopathy. Lungs/Pleura: Significant interval decrease in the size of the right pleural effusion status post catheter placement. The tip of the catheter is in the medial right lung base pleural surface. A small residual right pleural effusion as well as a small amount of air in the pleural space introduced via the catheter. There is partial consolidation of the right lower lobe with air bronchogram which may represent atelectasis or infiltrate. Trace left pleural effusion. The central airways are patent. Upper Abdomen: Retroperitoneal and retrocrural adenopathy as well as scattered omental masses consistent with implant. Musculoskeletal: Osteopenia with degenerative changes. No acute osseous pathology. Bilateral breast implants noted. IMPRESSION: 1. Significant interval decrease in the size of the right pleural effusion status post catheter placement. Small residual right pleural effusion as well as a small amount of air in the pleural space introduced via the catheter. 2. Partial consolidation of the right lower lobe with air bronchogram may represent atelectasis or infiltrate. 3. Trace left pleural effusion. 4. Similar appearance of mediastinal adenopathy. 5. Retroperitoneal and retrocrural adenopathy as well as scattered omental masses consistent with implant. Electronically Signed   By: Vanetta Chou M.D.   On: 06/02/2024 12:30   DG CHEST PORT 1 VIEW Result Date: 06/01/2024 EXAM: 1 VIEW XRAY OF THE CHEST 06/01/2024 12:42:00 PM COMPARISON: 05/31/2024 CLINICAL HISTORY: Chest tube in place. FINDINGS: LINES, TUBES AND DEVICES: Right basilar chest tube in place. LUNGS AND PLEURA: Unchanged small right pleural effusion. Hazy opacity at right lung base, likely representing atelectasis. HEART AND MEDIASTINUM: No acute abnormality of the cardiac and mediastinal silhouettes. BONES AND SOFT TISSUES: No acute osseous  abnormality.  IMPRESSION: 1. Right basilar chest tube present with unchanged small right pleural effusion. 2. Hazy right basilar opacity, compatible with atelectasis. Electronically signed by: Dayne Hassell MD 06/01/2024 05:02 PM EST RP Workstation: [id number]   DG CHEST PORT 1 VIEW Result Date: 05/31/2024 CLINICAL DATA:  Pleural effusion. EXAM: PORTABLE CHEST 1 VIEW COMPARISON:  05/30/2024 FINDINGS: Right chest tube is stable with the pigtail portion in the medial right lower chest. Persistent right basilar densities have not significantly changed. Negative for pneumothorax. Left lung is clear. Heart size is within normal limits and stable. IMPRESSION: 1. Right chest tube is stable in position. Negative for pneumothorax. 2. Persistent densities in the right lower chest. Findings are most compatible with residual pleural fluid and atelectasis. Residual pleural fluid may be loculated and could be confirmed with ultrasound if needed. Electronically Signed   By: Juliene Balder M.D.   On: 05/31/2024 14:25   IR LYMPH NODE CORE BIOPSY Result Date: 05/31/2024 INDICATION: 69 year old with evidence of metastatic disease and tissue diagnosis is needed. Patient has enlarged left supraclavicular lymph nodes. EXAM: ULTRASOUND-GUIDED CORE BIOPSY OF A LEFT SUPRACLAVICULAR LYMPH NODE MEDICATIONS: Moderate sedation ANESTHESIA/SEDATION: Moderate (conscious) sedation was employed during this procedure. A total of Versed  2 mg and Fentanyl  100 mcg was administered intravenously by the radiology nurse. Total intra-service moderate Sedation Time: 10 minutes. The patient's level of consciousness and vital signs were monitored continuously by radiology nursing throughout the procedure under my direct supervision. FLUOROSCOPY TIME:  None COMPLICATIONS: None immediate. PROCEDURE: Informed written consent was obtained from the patient after a thorough discussion of the procedural risks, benefits and alternatives. All questions were addressed. Maximal  Sterile Barrier Technique was utilized including caps, mask, sterile gowns, sterile gloves, sterile drape, hand hygiene and skin antiseptic. A timeout was performed prior to the initiation of the procedure. Ultrasound was used to identify enlarged left supraclavicular lymph nodes. Left supraclavicular area was prepped with chlorhexidine and sterile field was created. Skin was anesthetized using 1% lidocaine . Small incision was made. Using ultrasound guidance, 17 gauge coaxial needle was directed into an enlarged lymph node. Multiple core biopsies were obtained with an 18 gauge device. Specimens placed on a Telfa pad with saline. 17 gauge coaxial needle was removed. Bandage placed over the puncture site. FINDINGS: Multiple enlarged left supraclavicular lymph nodes. A solid hypoechoic lymph node was biopsied. Biopsy needle confirmed within the lymph node. No immediate bleeding or hematoma formation. IMPRESSION: Ultrasound-guided core biopsy from an enlarged left supraclavicular lymph node. Electronically Signed   By: Juliene Balder M.D.   On: 05/31/2024 14:22   DG CHEST PORT 1 VIEW Result Date: 05/30/2024 EXAM: 1 VIEW(S) XRAY OF THE CHEST 05/30/2024 05:30:00 AM COMPARISON: Portable chest yesterday at 11:44 am. CLINICAL HISTORY: Pleural effusion. FINDINGS: LINES, TUBES AND DEVICES: There is a pigtail right chest tube with the pigtail in the medial base of the right thorax. LUNGS AND PLEURA: There is moderate right pleural effusion, either improved or redistributed . There is Patchy airspace disease in the right mid lung overlying the effusion, and consolidation or atelectasis in the adjacent right base. The remainder of the lungs are clear. Moderate right pleural effusion remains, either improved or redistributed. No pneumothorax. HEART AND MEDIASTINUM: No acute abnormality of the cardiac and mediastinal silhouettes. BONES AND SOFT TISSUES: No acute osseous abnormality. IMPRESSION: 1. Moderate right pleural effusion,  either improved or redistributed, with a pigtail right chest tube in place. 2. Patchy airspace disease in the right mid lung overlying  the effusion, and consolidation or atelectasis in the adjacent right base. Electronically signed by: Francis Quam MD 05/30/2024 06:00 AM EST RP Workstation: [id number]   CT ABDOMEN PELVIS W CONTRAST Result Date: 05/29/2024 EXAM: CT ABDOMEN AND PELVIS WITH CONTRAST 05/29/2024 04:58:02 PM TECHNIQUE: CT of the abdomen and pelvis was performed with the administration of 100 mL of iohexol  (OMNIPAQUE ) 300 MG/ML solution. Multiplanar reformatted images are provided for review. Automated exposure control, iterative reconstruction, and/or weight-based adjustment of the mA/kV was utilized to reduce the radiation dose to as low as reasonably achievable. COMPARISON: Partial comparison CT chest dated 05/28/2024. CLINICAL HISTORY: Metastatic disease evaluation. FINDINGS: LOWER CHEST: Moderate right pleural effusion with indwelling pleural drain. Right middle lobe and right lower lobe atelectasis/collapse. Retrocrural nodal metastases measuring up to 3.0 cm on the right (image 14). LIVER: Subcentimeter posterior right hepatic cyst, benign. GALLBLADDER AND BILE DUCTS: Gallbladder is unremarkable. No biliary ductal dilatation. SPLEEN: No acute abnormality. PANCREAS: No acute abnormality. ADRENAL GLANDS: No acute abnormality. KIDNEYS, URETERS AND BLADDER: No stones in the kidneys or ureters. No hydronephrosis. No perinephric or periureteral stranding. Urinary bladder is unremarkable. GI AND BOWEL: Stomach demonstrates no acute abnormality. Additional peritoneal disease in the left upper abdomen adjacent to the stomach and spleen (image 20). There is no bowel obstruction. PERITONEUM AND RETROPERITONEUM: Small volume pelvic ascites. No free air. 8.4 x 4.7 cm mass beneath the left lower anterior abdominal wall (image 61), and additional 7.0 x 4.5 cm mass beneath the midline anterior abdominal wall  (image 50), suspicious for peritoneal disease. VASCULATURE: Aorta is normal in caliber. LYMPH NODES: Pelvic/retroperitoneal nodal metastases, including a dominant 3.3 cm left periaortic node (image 34). REPRODUCTIVE ORGANS: Large, lobulated pelvic mass inseparable from the uterus measuring 14.7 x 12.6 cm (image 68), compatible with uterine malignancy. BONES AND SOFT TISSUES: No acute osseous abnormality. Bilateral breast augmentation. IMPRESSION: 1. Large, lobulated pelvic mass inseparable from the uterus measuring 14.7 cm, compatible with uterine malignancy. 2. Pelvic, retroperitoneal, and retrocrural nodal metastases. 3. Peritoneal disease, as above, with small volume pelvic ascites. 4. Moderate right pleural effusion with indwelling pleural drain. Electronically signed by: Pinkie Pebbles MD 05/29/2024 08:12 PM EST RP Workstation: [id number]   DG CHEST PORT 1 VIEW Result Date: 05/29/2024 EXAM: 1 VIEW(S) XRAY OF THE CHEST 05/29/2024 11:47:00 AM COMPARISON: 05/28/2024 CLINICAL HISTORY: Encounter for chest tube placement FINDINGS: LINES, TUBES AND DEVICES: Right chest tube with pigtail overlying the medial right lower hemithorax. LUNGS AND PLEURA: Large right pleural effusion, decreased compared to prior exam. No focal pulmonary opacity. No pneumothorax. HEART AND MEDIASTINUM: No acute abnormality of the cardiac and mediastinal silhouettes. BONES AND SOFT TISSUES: Cystic changes in the right glenoid, likely degenerative. IMPRESSION: 1. Right chest tube with pigtail overlying the medial right lower hemithorax. 2. Large right pleural effusion, decreased. Electronically signed by: Waddell Calk MD 05/29/2024 12:00 PM EST RP Workstation: [id number]   CT Chest Wo Contrast Result Date: 05/28/2024 EXAM: CT CHEST WITHOUT CONTRAST 05/28/2024 06:23:00 PM TECHNIQUE: CT of the chest was performed without the administration of intravenous contrast. Multiplanar reformatted images are provided for review. Automated exposure  control, iterative reconstruction, and/or weight based adjustment of the mA/kV was utilized to reduce the radiation dose to as low as reasonably achievable. COMPARISON: 10/26/2024, 05/26/2024 CLINICAL HISTORY: Pleural effusion, malignancy suspected. FINDINGS: MEDIASTINUM: Heart and pericardium are unremarkable. The central airways are clear. Significant mass effect and rightward shift of the cardiomediastinal structures due to the pleural effusion. Enlarged left lower neck lymph nodes with  stippled calcification throughout, the largest measuring 3.5 cm in transverse dimension (axial 14). These lymph nodes cause significant mass effect, rightward shift, and severe narrowing of the trachea of the thoracic inlet. LYMPH NODES: Multiple enlarged mediastinal lymph nodes. For example, there is a prevascular lymph node measuring 1.5 cm (axial 34), containing stippled calcification. Multiple enlarged AP window lymph nodes measuring up to 2 cm (axial 57). Clustered enlarged retrocrural lymph nodes measuring up to 1.7 cm on the right (axial 117). Cardiophrenic lymph nodes are also prominent measuring up to 8 mm (axial 95). Enlarged superior left retroperitoneal lymph node measuring up to 1.6 cm (axial 145). LUNGS AND PLEURA: Redemonstrated large right pleural effusion with complete collapse of the right middle and right lower lobes. Near complete collapse of the right upper lobe is also present. No pneumothorax. SOFT TISSUES/BONES: Multilevel thoracic osteophytosis. Peripherally calcified bilateral breast implants. Findings suggestive of intracapsular rupture noted. UPPER ABDOMEN: Limited images of the upper abdomen demonstrates no acute abnormality. Enlarged superior left retroperitoneal lymph node measuring up to 1.6 cm (axial 145). IMPRESSION: 1. Large right pleural effusion with complete collapse of the right middle and right lower lobes, near complete collapse of the right upper lobe, and significant mass effect and  rightward shift of the cardiomediastinal structures. Underlying pulmonary neoplasm or hilar neoplasm is difficult to exclude given the lack of intravenous contrast. 2. Enlarged left lower neck lymph nodes with stippled calcification causing significant mass effect, rightward shift, and severe narrowing of the trachea at the thoracic inlet. This is consistent with metastatic disease. Clinical correlation for patient's respiratory status requested. 3. Extensive lymphadenopathy involving the mediastinum, retrocrural region, and superior left retroperitoneum, some with stippled calcification, consistent with either metastatic lymphadenopathy or underlying lymphoma. Electronically signed by: Rogelia Myers MD 05/28/2024 06:54 PM EST RP Workstation: [id number]   DG Chest Port 1 View Result Date: 05/28/2024 CLINICAL DATA:  Shortness of breath and cough EXAM: PORTABLE CHEST 1 VIEW COMPARISON:  05/28/2024 FINDINGS: Large right-sided pleural effusion, increased compared to prior. Underlying airspace disease. Stable cardiomediastinal silhouette with aortic atherosclerosis. No pneumothorax. Bilateral breast implants. IMPRESSION: Large right-sided pleural effusion, increased compared to prior. Underlying pneumonia or mass not excluded. Electronically Signed   By: Luke Bun M.D.   On: 05/28/2024 17:56   DG Chest 2 View Result Date: 05/26/2024 CLINICAL DATA:  Cough for 4 weeks, right chest pain. EXAM: CHEST - 2 VIEW COMPARISON:  None Available. FINDINGS: Trachea is midline. Heart is at the upper limits of normal in size. Large right pleural effusion with collapse/consolidation in the right lung base. Left lung is clear. Degenerative changes in the spine. IMPRESSION: Large right pleural effusion with collapse/consolidation in the right lung base. Findings may be due to pneumonia with a parapneumonic effusion. Empyema cannot be excluded, nor can underlying malignancy. Followup PA and lateral chest X-ray is recommended in  3-4 weeks following trial of antibiotic therapy to ensure resolution and exclude underlying malignancy. Electronically Signed   By: Newell Eke M.D.   On: 05/26/2024 10:38      06/02/2024 Initial Diagnosis   Malignant neoplasm of both ovaries (HCC)   06/07/2024 Cancer Staging   Staging form: Ovary, Fallopian Tube, and Primary Peritoneal Carcinoma, AJCC 8th Edition - Clinical: FIGO Stage IVB (cT3c, cN1, pM1b) - Signed by Lonn Hicks, MD on 06/07/2024 Stage prefix: Initial diagnosis   06/15/2024 -  Chemotherapy   Patient is on Treatment Plan : OVARIAN Carboplatin  (AUC 6) + Paclitaxel  (175) q21d X 6 Cycles  RISK FACTORS:  Menarche was at age 70.  First live birth at age 29.  OCP use for approximately 10 years.  Ovaries intact: yes.  Hysterectomy: yes.  Menopausal status: postmenopausal.  HRT use: 0 years. Colonoscopy: yes; reports having polyps. Mammogram within the last year: yes. Number of breast biopsies: 0.   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   Pleural effusion, malignant (HCC) 2025    Past Surgical History:  Procedure Laterality Date   AUGMENTATION MAMMAPLASTY Bilateral    Subglandular, pt's second set of implants   BREAST ENHANCEMENT SURGERY     IR US  LIVER BIOPSY  05/31/2024   let rotator cuff surgery  08/2022   PARTIAL HYSTERECTOMY      Social History   Socioeconomic History   Marital status: Single    Spouse name: Not on file   Number of children: Not on file   Years of education: Not on file   Highest education level: Not on file  Occupational History   Not on file  Tobacco Use   Smoking status: Former    Current packs/day: 0.00    Types: Cigarettes    Quit date: 6    Years since quitting: 40.1   Smokeless tobacco: Never   Tobacco comments:    1 pack a week  Substance and Sexual  Activity   Alcohol use: Yes    Comment: 1-2 times per month   Drug use: Not Currently   Sexual activity: Not Currently  Other Topics Concern   Not on file  Social History Narrative   Not on file   Social Drivers of Health   Tobacco Use: Medium Risk (05/29/2024)   Patient History    Smoking Tobacco Use: Former    Smokeless Tobacco Use: Never    Passive Exposure: Not on Actuary Strain: Not on file  Food Insecurity: No Food Insecurity (06/15/2024)   Epic    Worried About Radiation Protection Practitioner of Food in the Last Year: Never true    Ran Out of Food in the Last Year: Never true  Transportation Needs: No Transportation Needs (06/15/2024)   Epic    Lack of Transportation (Medical): No    Lack of Transportation (Non-Medical): No  Physical Activity: Not on file  Stress: Not on file  Social Connections: Socially Isolated (05/28/2024)   Social Connection and Isolation Panel    Frequency of Communication with Friends and Family: More than three times a week    Frequency of Social Gatherings with Friends and Family: Once a week    Attends Religious Services: Never    Database Administrator or Organizations: No    Attends Banker Meetings: Never    Marital Status: Divorced  Depression (PHQ2-9): Low Risk (06/15/2024)   Depression (PHQ2-9)    PHQ-2 Score: 1  Recent Concern: Depression (PHQ2-9) - High Risk (05/28/2024)   Depression (PHQ2-9)    PHQ-2 Score: 15  Alcohol Screen: Not on file  Housing: Low Risk (06/15/2024)   Epic    Unable to Pay for Housing in the Last Year: No    Number of Times Moved in the Last Year: 0    Homeless in the Last Year: No  Utilities: Not At Risk (06/15/2024)   Epic    Threatened with loss of  utilities: No  Health Literacy: Not on file     FAMILY HISTORY:  We obtained a detailed, 4-generation family history.  Significant diagnoses are listed below: Family History  Problem Relation Age of Onset   Alzheimer's disease Mother    Colon cancer  Maternal Grandmother    Bladder Cancer Maternal Grandfather    Breast cancer Half-Sister        dx. 64s; maternal half     Significant family history reported includes:  Maternal side: Half sister with breast cancer in her 69's Grandmother with colon cancer, passing away at 5 Grandfather with bladder cancer  Paternal side: No information, but there is Jewish ancestry  Ms. Klinedinst is unaware of previous family history of genetic testing for hereditary cancer risks.  There is reported Ashkenazi Jewish ancestry. There is known consanguinity between her maternal grandparents - they are first cousins.  GENETIC COUNSELING ASSESSMENT: Ms. Masterson is a 69 y.o. female with a personal and family history of cancer which is somewhat suggestive of a hereditary cancer syndrome and predisposition to cancer given the combination of cancer and Ashkenazi Jewish ancestry. We, therefore, discussed and recommended the following at today's visit.   DISCUSSION: We discussed that, in general, most cancer is not inherited in families, but instead is sporadic or familial. Sporadic cancers occur by chance and typically happen at older ages (>50 years) as this type of cancer is caused by genetic changes acquired during an individuals lifetime. Some families have more cancers than would be expected by chance; however, the ages or types of cancer are not consistent with a known genetic mutation or known genetic mutations have been ruled out. This type of familial cancer is thought to be due to a combination of multiple genetic, environmental, hormonal, and lifestyle factors. While this combination of factors likely increases the risk of cancer, the exact source of this risk is not currently identifiable or testable.  We discussed that up to 20% of ovarian cancer is hereditary, with most cases associated with BRCA mutations.  There are other genes that can be associated with hereditary ovarian cancer syndromes.  These  include BRIP1, RAD51C, RAD51D and the Lynch syndrome genes.  We discussed that testing is beneficial for several reasons including knowing how to follow individuals after completing their treatment, identifying whether potential treatment options such as PARP inhibitors would be beneficial, and understand if other family members could be at risk for cancer and allow them to undergo genetic testing.   We reviewed the characteristics, features and inheritance patterns of hereditary cancer syndromes. We also discussed genetic testing, including the appropriate family members to test, the process of testing, insurance coverage and turn-around-time for results. We discussed the implications of a negative, positive, carrier and/or variant of uncertain significant result. Ms. Riviere decided to pursue genetic testing for the CancerNext-Expanded+RNAinsight gene panel.   The CancerNext-Expanded gene panel offered by [facility] and includes sequencing, rearrangement, and RNA analysis for the following 77 genes: AIP, ALK, APC, ATM, BAP1, BARD1, BMPR1A, BRCA1, BRCA2, BRIP1, CDC73, CDH1, CDK4, CDKN1B, CDKN2A, CEBPA, CHEK2, CTNNA1, DDX41, DICER1, ETV6, FH, FLCN, GATA2, LZTR1, MAX, MBD4, MEN1, MET, MLH1, MSH2, MSH3, MSH6, MUTYH, NF1, NF2, NTHL1, PALB2, PHOX2B, PMS2, POT1, PRKAR1A, PTCH1, PTEN, RAD51C, RAD51D, RB1, RET, RPS20, RUNX1, SDHA, SDHAF2, SDHB, SDHC, SDHD, SMAD4, SMARCA4, SMARCB1, SMARCE1, STK11, SUFU, TMEM127, TP53, TSC1, TSC2, VHL, and WT1 (sequencing and deletion/duplication); AXIN2, CTNNA1, DDX41, EGFR, HOXB13, KIT, MBD4, MITF, MSH3, PDGFRA, POLD1 and POLE (sequencing only); EPCAM and GREM1 (deletion/duplication  only). RNA data is routinely analyzed for use in variant interpretation for all genes.   Based on Ms. Leising's personal and family history of cancer, she meets medical criteria for genetic testing. Despite that she meets criteria, she may still have an out of pocket cost. We discussed that if her out  of pocket cost for testing is over $100, the laboratory will call and confirm whether she wants to proceed with testing.  If the out of pocket cost of testing is less than $100 she will be billed by the genetic testing laboratory. Ms. Lablanc was given information about [facility] and will consider applying for it.  We discussed that some people do not want to undergo genetic testing due to fear of genetic discrimination.  The Genetic Information Nondiscrimination Act (GINA) was signed into federal law in 2008. GINA prohibits health insurers and most employers from discriminating against individuals based on genetic information (including the results of genetic tests and family history information). According to GINA, health insurance companies cannot consider genetic information to be a preexisting condition, nor can they use it to make decisions regarding coverage or rates. GINA also makes it illegal for most employers to use genetic information in making decisions about hiring, firing, promotion, or terms of employment. It is important to note that GINA does not offer protections for life insurance, disability insurance, or long-term care insurance. GINA does not apply to those in the [employer], those who work for companies with less than 15 employees, and new life insurance or long-term disability insurance policies.  Health status due to a cancer diagnosis is not protected under GINA. More information about GINA can be found by visiting [email or web address].  PLAN: After considering the risks, benefits, and limitations, Ms. Salvador provided informed consent to pursue genetic testing today, rather than waiting until her next lab appointments, and the blood sample was sent to [facility] for analysis of the CancerNext-Expanded+RNAinsight. Results should be available within approximately 2-3 weeks' time, at which point they will be disclosed by telephone to Ms. Maslanka, as will  any additional recommendations warranted by these results. Ms. Hoeppner will receive a summary of her genetic counseling visit and a copy of her results once available. This information will also be available in Epic.   Lastly, we encouraged Ms. Hew to remain in contact with cancer genetics annually so that we can continuously update the family history and inform her of any changes in cancer genetics and testing that may be of benefit for this family.   Ms. Beevers questions were answered to her satisfaction today. Our contact information was provided should additional questions or concerns arise. Thank you for the referral and allowing us  to share in the care of your patient.   Arushi Partridge P. Perri, MS, CGC Licensed, Patent Attorney [email or web address] phone: [phone]  I personally spent a total of 70 minutes in the care of the patient today including preparing to see the patient, getting/reviewing separately obtained history, counseling and educating, placing orders, and documenting clinical information in the EHR. The patient brought her daughter. Drs. Lanny Stalls, and/or Gudena were available for questions, if needed..    _______________________________________________________________________ For Office Staff:  Number of people involved in session: 2 Was an Intern/ student involved with case: no

## 2024-06-17 NOTE — Progress Notes (Signed)
 "              Jodi Cameron    [id number]    1955/11/17  Primary Care Physician:Yacopino, Harlene CROME, NP  Referring Physician: [name], MD No address on file  Chief complaint: Follow-up for pleural effusion  HPI: 69 y.o. who  has a past medical history of Family history of bladder cancer, Family history of breast cancer, Ovarian cancer (HCC) (2025), and Pleural effusion, malignant (HCC) (2025).  Discussed the use of AI scribe software for clinical note transcription with the patient, who gave verbal consent to proceed.  History of Present Illness Jodi Cameron is a 69 year old female with metastatic ovarian cancer who presents with a large right pleural effusion. She is accompanied by her daughter, Brittany, who is a engineer, civil (consulting). She was referred by Dr. Adrien for evaluation of her pleural effusion.  Pleural effusion and respiratory symptoms - Large right pleural effusion previously treated with chest tube drainage - No current dyspnea or breathing problems - Performs breathing exercises to promote lung expansion - History of presumed viral illness in November that progressed to dyspnea, leading to urgent care evaluation - Treated with antibiotics [Augmentin , azithromycin ] for presumed pneumonia in January prior to diagnosis of metastatic cancer  Malignancy and oncologic treatment - Metastatic ovarian cancer confirmed by supraclavicular lymph node biopsy and pleural fluid cytology - Currently receiving chemotherapy with carboplatin  and Taxol  - On steroid therapy; initial improvement in energy, but steroid effect is lessening - Feels well with good energy since starting treatment  Relevant Pulmonary history: Pets: Dog, cat Occupation: Retired from chief of staff Exposures: No mold, hot tub, Jacuzzi.  No feather pillow comforter No h/o chemo/XRT/amiodarone/macrodantin/MTX  No exposure to asbestos, silica or other organic allergens  Smoking history: Smoked half a pack per  day for thirty years. Recently quit smoking, though occasional smoking persists per daughter Travel history: Lived in multiple states in the past.  In [place]  since 2019.  No significant recent travel Family history: No family history of lung disease  Outpatient Encounter Medications as of 06/17/2024  Medication Sig   bisacodyl  (DULCOLAX) 5 MG EC tablet Take 2 tablets (10 mg total) by mouth daily.   dexamethasone  (DECADRON ) 4 MG tablet Take 2 tabs at the night before and 2 tab the morning of chemotherapy, every 3 weeks, by mouth x 6 cycles   docusate sodium  (COLACE) 100 MG capsule Take 1 capsule (100 mg total) by mouth 2 (two) times daily.   mirtazapine  (REMERON ) 7.5 MG tablet Take 1 tablet (7.5 mg total) by mouth at bedtime.   ondansetron  (ZOFRAN ) 4 MG tablet Take 1 tablet (4 mg total) by mouth every 6 (six) hours as needed for nausea.   polyethylene glycol (MIRALAX  / GLYCOLAX ) 17 g packet Take 17 g by mouth 2 (two) times daily.   senna-docusate (SENOKOT-S) 8.6-50 MG tablet Take 1 tablet by mouth at bedtime. (Patient taking differently: Take 1 tablet by mouth at bedtime. PRN)   chlorpheniramine-HYDROcodone  (TUSSIONEX) 10-8 MG/5ML Take 5 mLs by mouth every 12 (twelve) hours as needed for cough. (Patient not taking: Reported on 06/17/2024)   Cholecalciferol (VITAMIN D3) 1.25 MG (50000 UT) TABS Take by mouth. (Patient not taking: Reported on 06/17/2024)   HYDROcodone -acetaminophen  (NORCO/VICODIN) 5-325 MG tablet Take 1-2 tablets by mouth every 6 (six) hours as needed for moderate pain (pain score 4-6) or severe pain (pain score 7-10). (Patient not taking: Reported on 06/17/2024)   prochlorperazine  (COMPAZINE ) 10 MG tablet Take 1  tablet (10 mg total) by mouth every 6 (six) hours as needed for nausea or vomiting. (Patient not taking: Reported on 06/17/2024)   pyridOXINE (B-6) 50 MG tablet Take 50 mg by mouth daily.   No facility-administered encounter medications on file as of 06/17/2024.     Physical  Exam: Today's Vitals   06/17/24 1045  BP: 132/80  Pulse: 72  Temp: (!) 96.8 F (36 C)  TempSrc: Oral  SpO2: 98%  Weight: 137 lb (62.1 kg)  Height: 5' 7 (1.702 m)   Body mass index is 21.46 kg/m.  Physical Exam GEN: No acute distress CV: Regular rate and rhythm no murmurs LUNGS: Slightly diminished breath sounds, otherwise clear to auscultation bilaterally with normal respiratory effort SKIN JOINTS: Warm and dry no rash  Data Reviewed: Imaging: CT chest 06/02/2024-significant interval decrease in right pleural effusion.  Small residual right effusion with air.  Partial consolidation of the right lower lobe.  Trace left effusion. I have reviewed the images personally.  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 No underlying pneumonia on CT post-drainage. Lung sounds slightly diminished, indicating possible reaccumulation of fluid. Currently asymptomatic with no significant breathing issues. Chemotherapy with carboplatin  and Taxol  planned, which may reduce fluid accumulation. Potential for reaccumulation post-treatment, with option for pleurX catheter if necessary. - Ordered chest x-ray to assess current status of pleural effusion. - Continue chemotherapy with carboplatin  and Taxol  as planned. - Monitor for symptoms of increased shortness of breath or respiratory distress. - Will consider pleurX catheter placement if pleural effusion reaccumulates and becomes symptomatic.  Recommendations: Chest x-ray  Lonna Coder MD  Pulmonary and Critical Care 06/17/2024, 11:17 AM  CC: [name], MD    "

## 2024-06-18 ENCOUNTER — Encounter: Payer: Self-pay | Admitting: Gynecologic Oncology

## 2024-06-18 LAB — CULTURE, FUNGUS WITHOUT SMEAR

## 2024-06-18 LAB — CA 125: Cancer Antigen (CA) 125: 3936 U/mL — ABNORMAL HIGH (ref 0.0–38.1)

## 2024-06-21 ENCOUNTER — Inpatient Hospital Stay: Admitting: Hematology and Oncology

## 2024-07-05 ENCOUNTER — Inpatient Hospital Stay

## 2024-07-05 ENCOUNTER — Inpatient Hospital Stay: Admitting: Hematology and Oncology

## 2024-07-06 ENCOUNTER — Ambulatory Visit: Admitting: Student

## 2024-09-15 ENCOUNTER — Ambulatory Visit: Admitting: Pulmonary Disease
